# Patient Record
Sex: Male | Born: 1935 | Race: White | Hispanic: No | Marital: Married | Smoking: Never smoker
Health system: Southern US, Community
[De-identification: ages and names within clinical notes are randomized; demographics above are authoritative.]

## PROBLEM LIST (undated history)

## (undated) DIAGNOSIS — N400 Enlarged prostate without lower urinary tract symptoms: Secondary | ICD-10-CM

## (undated) DIAGNOSIS — E785 Hyperlipidemia, unspecified: Secondary | ICD-10-CM

## (undated) DIAGNOSIS — D62 Acute posthemorrhagic anemia: Secondary | ICD-10-CM

## (undated) DIAGNOSIS — K219 Gastro-esophageal reflux disease without esophagitis: Secondary | ICD-10-CM

## (undated) DIAGNOSIS — D0322 Melanoma in situ of left ear and external auricular canal: Secondary | ICD-10-CM

## (undated) DIAGNOSIS — K922 Gastrointestinal hemorrhage, unspecified: Secondary | ICD-10-CM

## (undated) DIAGNOSIS — K5790 Diverticulosis of intestine, part unspecified, without perforation or abscess without bleeding: Secondary | ICD-10-CM

## (undated) DIAGNOSIS — I10 Essential (primary) hypertension: Secondary | ICD-10-CM

## (undated) HISTORY — DX: Gastro-esophageal reflux disease without esophagitis: K21.9

## (undated) HISTORY — DX: Acute posthemorrhagic anemia: D62

## (undated) HISTORY — DX: Benign prostatic hyperplasia without lower urinary tract symptoms: N40.0

## (undated) HISTORY — DX: Hyperlipidemia, unspecified: E78.5

## (undated) HISTORY — DX: Melanoma in situ of left ear and external auricular canal: D03.22

## (undated) HISTORY — PX: CATARACT EXTRACTION: SUR2

## (undated) HISTORY — DX: Diverticulosis of intestine, part unspecified, without perforation or abscess without bleeding: K57.90

## (undated) HISTORY — DX: Gastrointestinal hemorrhage, unspecified: K92.2

## (undated) HISTORY — DX: Essential (primary) hypertension: I10

---

## 2001-05-16 ENCOUNTER — Encounter (INDEPENDENT_AMBULATORY_CARE_PROVIDER_SITE_OTHER): Payer: Self-pay

## 2001-05-16 ENCOUNTER — Other Ambulatory Visit: Admission: RE | Admit: 2001-05-16 | Discharge: 2001-05-16 | Payer: Self-pay | Admitting: Internal Medicine

## 2006-01-13 ENCOUNTER — Emergency Department (HOSPITAL_COMMUNITY): Admission: EM | Admit: 2006-01-13 | Discharge: 2006-01-14 | Payer: Self-pay | Admitting: Emergency Medicine

## 2006-05-27 ENCOUNTER — Ambulatory Visit: Payer: Self-pay | Admitting: Internal Medicine

## 2006-06-10 ENCOUNTER — Ambulatory Visit: Payer: Self-pay | Admitting: Internal Medicine

## 2009-05-22 ENCOUNTER — Encounter (INDEPENDENT_AMBULATORY_CARE_PROVIDER_SITE_OTHER): Payer: Self-pay | Admitting: *Deleted

## 2009-07-28 ENCOUNTER — Encounter (INDEPENDENT_AMBULATORY_CARE_PROVIDER_SITE_OTHER): Payer: Self-pay | Admitting: *Deleted

## 2009-07-29 ENCOUNTER — Ambulatory Visit: Payer: Self-pay | Admitting: Internal Medicine

## 2009-07-31 ENCOUNTER — Encounter (INDEPENDENT_AMBULATORY_CARE_PROVIDER_SITE_OTHER): Payer: Self-pay | Admitting: *Deleted

## 2010-01-20 ENCOUNTER — Telehealth (INDEPENDENT_AMBULATORY_CARE_PROVIDER_SITE_OTHER): Payer: Self-pay | Admitting: *Deleted

## 2010-09-29 NOTE — Progress Notes (Signed)
Summary: Schedule recall colonoscopy  Phone Note Outgoing Call Call back at [facility] Phone [phone]   Call placed by: Christie Nottingham CMA Duncan Dull),  Jan 20, 2010 2:07 PM Call placed to: Patient Summary of Call: left message for pt  to call back and schedule recall colonoscopy. Initial call taken by: Christie Nottingham CMA Duncan Dull),  Jan 20, 2010 2:07 PM  Follow-up for Phone Call        left message for pt  to call back  Follow-up by: Christie Nottingham CMA Duncan Dull),  January 28, 2010 10:55 AM

## 2010-12-28 ENCOUNTER — Emergency Department (HOSPITAL_COMMUNITY): Payer: Medicare Other

## 2010-12-28 ENCOUNTER — Emergency Department (HOSPITAL_COMMUNITY)
Admission: EM | Admit: 2010-12-28 | Discharge: 2010-12-28 | Disposition: A | Discharge status: Home / Self Care | Payer: Medicare Other | Attending: Emergency Medicine | Admitting: Emergency Medicine

## 2010-12-28 DIAGNOSIS — Z79899 Other long term (current) drug therapy: Secondary | ICD-10-CM | POA: Insufficient documentation

## 2010-12-28 DIAGNOSIS — I10 Essential (primary) hypertension: Secondary | ICD-10-CM | POA: Insufficient documentation

## 2010-12-28 DIAGNOSIS — R259 Unspecified abnormal involuntary movements: Secondary | ICD-10-CM | POA: Insufficient documentation

## 2010-12-28 DIAGNOSIS — H538 Other visual disturbances: Secondary | ICD-10-CM | POA: Insufficient documentation

## 2010-12-28 LAB — CBC
MCH: 30.6 pg (ref 26.0–34.0)
MCV: 85 fL (ref 78.0–100.0)
Platelets: 258 10*3/uL (ref 150–400)
RDW: 12.8 % (ref 11.5–15.5)

## 2010-12-28 LAB — DIFFERENTIAL
Eosinophils Absolute: 0.2 10*3/uL (ref 0.0–0.7)
Eosinophils Relative: 2 % (ref 0–5)
Lymphs Abs: 2.1 10*3/uL (ref 0.7–4.0)
Monocytes Absolute: 0.8 10*3/uL (ref 0.1–1.0)
Monocytes Relative: 10 % (ref 3–12)

## 2010-12-28 LAB — POCT I-STAT, CHEM 8
Calcium, Ion: 1 mmol/L — ABNORMAL LOW (ref 1.12–1.32)
HCT: 40 % (ref 39.0–52.0)
TCO2: 26 mmol/L (ref 0–100)

## 2012-03-14 ENCOUNTER — Encounter: Payer: Self-pay | Admitting: Internal Medicine

## 2016-08-16 DIAGNOSIS — I10 Essential (primary) hypertension: Secondary | ICD-10-CM | POA: Diagnosis not present

## 2016-08-16 DIAGNOSIS — K219 Gastro-esophageal reflux disease without esophagitis: Secondary | ICD-10-CM

## 2016-08-16 DIAGNOSIS — K922 Gastrointestinal hemorrhage, unspecified: Secondary | ICD-10-CM

## 2016-08-16 DIAGNOSIS — K5790 Diverticulosis of intestine, part unspecified, without perforation or abscess without bleeding: Secondary | ICD-10-CM

## 2016-08-16 DIAGNOSIS — N400 Enlarged prostate without lower urinary tract symptoms: Secondary | ICD-10-CM

## 2016-08-16 DIAGNOSIS — D62 Acute posthemorrhagic anemia: Secondary | ICD-10-CM | POA: Diagnosis not present

## 2016-08-16 DIAGNOSIS — E785 Hyperlipidemia, unspecified: Secondary | ICD-10-CM | POA: Diagnosis not present

## 2016-08-17 DIAGNOSIS — I10 Essential (primary) hypertension: Secondary | ICD-10-CM

## 2016-08-17 DIAGNOSIS — D62 Acute posthemorrhagic anemia: Secondary | ICD-10-CM

## 2016-08-17 DIAGNOSIS — N400 Enlarged prostate without lower urinary tract symptoms: Secondary | ICD-10-CM

## 2016-08-17 DIAGNOSIS — K922 Gastrointestinal hemorrhage, unspecified: Secondary | ICD-10-CM

## 2016-08-17 DIAGNOSIS — E785 Hyperlipidemia, unspecified: Secondary | ICD-10-CM

## 2016-08-17 DIAGNOSIS — K219 Gastro-esophageal reflux disease without esophagitis: Secondary | ICD-10-CM

## 2016-08-17 DIAGNOSIS — K5790 Diverticulosis of intestine, part unspecified, without perforation or abscess without bleeding: Secondary | ICD-10-CM

## 2016-08-18 DIAGNOSIS — D62 Acute posthemorrhagic anemia: Secondary | ICD-10-CM | POA: Diagnosis not present

## 2016-08-18 DIAGNOSIS — N400 Enlarged prostate without lower urinary tract symptoms: Secondary | ICD-10-CM | POA: Diagnosis not present

## 2016-08-18 DIAGNOSIS — K922 Gastrointestinal hemorrhage, unspecified: Secondary | ICD-10-CM | POA: Diagnosis not present

## 2016-08-18 DIAGNOSIS — K5790 Diverticulosis of intestine, part unspecified, without perforation or abscess without bleeding: Secondary | ICD-10-CM | POA: Diagnosis not present

## 2016-08-19 DIAGNOSIS — D62 Acute posthemorrhagic anemia: Secondary | ICD-10-CM | POA: Diagnosis not present

## 2016-08-19 DIAGNOSIS — N400 Enlarged prostate without lower urinary tract symptoms: Secondary | ICD-10-CM | POA: Diagnosis not present

## 2016-08-19 DIAGNOSIS — K5790 Diverticulosis of intestine, part unspecified, without perforation or abscess without bleeding: Secondary | ICD-10-CM | POA: Diagnosis not present

## 2016-08-19 DIAGNOSIS — K922 Gastrointestinal hemorrhage, unspecified: Secondary | ICD-10-CM | POA: Diagnosis not present

## 2016-08-20 DIAGNOSIS — K5790 Diverticulosis of intestine, part unspecified, without perforation or abscess without bleeding: Secondary | ICD-10-CM | POA: Diagnosis not present

## 2016-08-20 DIAGNOSIS — K922 Gastrointestinal hemorrhage, unspecified: Secondary | ICD-10-CM | POA: Diagnosis not present

## 2016-08-20 DIAGNOSIS — D62 Acute posthemorrhagic anemia: Secondary | ICD-10-CM | POA: Diagnosis not present

## 2016-08-20 DIAGNOSIS — N400 Enlarged prostate without lower urinary tract symptoms: Secondary | ICD-10-CM | POA: Diagnosis not present

## 2016-09-20 ENCOUNTER — Encounter: Payer: Self-pay | Admitting: Internal Medicine

## 2016-11-04 ENCOUNTER — Ambulatory Visit (INDEPENDENT_AMBULATORY_CARE_PROVIDER_SITE_OTHER): Payer: Medicare Other | Admitting: Internal Medicine

## 2016-11-04 ENCOUNTER — Encounter (INDEPENDENT_AMBULATORY_CARE_PROVIDER_SITE_OTHER): Payer: Self-pay

## 2016-11-04 ENCOUNTER — Encounter: Payer: Self-pay | Admitting: Internal Medicine

## 2016-11-04 VITALS — BP 160/134 | HR 72 | Ht 71.46 in | Wt 216.4 lb

## 2016-11-04 DIAGNOSIS — D62 Acute posthemorrhagic anemia: Secondary | ICD-10-CM | POA: Diagnosis not present

## 2016-11-04 DIAGNOSIS — Z8719 Personal history of other diseases of the digestive system: Secondary | ICD-10-CM

## 2016-11-04 MED ORDER — NA SULFATE-K SULFATE-MG SULF 17.5-3.13-1.6 GM/177ML PO SOLN: 1.0000 | Freq: Once | ORAL | 0 refills | Status: AC

## 2016-11-04 NOTE — Progress Notes (Signed)
HISTORY OF PRESENT ILLNESS:  Donald Ball is a 81 y.o. male who is referred by his primary care provider Dr. Welton FlakesKhan after hospitalization elsewhere for acute GI bleeding. I have not seen the patient since October 2007 when he underwent colonoscopy for history of adenomatous colon polyps. Index examination performed in 2002 revealed large tubulovillous adenoma. Follow-up examination 2003. Most recent examination October 2007 with severe diverticulosis throughout but no polyps. Follow-up in 3 years recommended. He has not followed up and acknowledges such. He is accompanied today by his wife. He was in usual state of health until December 2017 when while at home he developed significant painless hematochezia. He presented to his local hospital. Several additional bouts of hematochezia associated with a syncopal spell. He was seen by gastroenterology. I'm told that he had an unremarkable upper endoscopy. He was in the hospital for about 5 days. Bleeding resolved without recurrence. He was placed on iron supplements. His blood counts were as low as 7.8 for which she received 3 units of packed red blood cells while hospitalized. Outpatient hemoglobin in January was 10. Most recently in February 12.1. Patient does complain of occasional left-sided cramping discomfort though this is infrequent. Does have a history of GERD for which she takes omeprazole 20 mg daily. Despite this, some breakthrough symptoms. No dysphagia. GI review of systems otherwise negative. He does have questions regarding prophylactic daily aspirin for which he was told to stop  REVIEW OF SYSTEMS:  All non-GI ROS negative upon extensive comprehensive review  Past Medical History:  Diagnosis Date  . Anemia associated with acute blood loss   . Benign essential hypertension   . BPH (benign prostatic hyperplasia)   . Diverticulosis   . GERD (gastroesophageal reflux disease)   . GI bleed   . Hyperlipidemia   . Melanoma in situ of left  ear [facility](HCC)     Past Surgical History:  Procedure Laterality Date  . CATARACT EXTRACTION      Social History Donald ChristmasJack V Lemley  reports that he has never smoked. He has never used smokeless tobacco. He reports that he does not drink alcohol or use drugs.  family history includes Diabetes in his brother and sister; Lung cancer in his father; Other in his mother; Prostate cancer in his brother.  Allergies  Allergen Reactions  . Altace [Ramipril]   . Hydralazine   . Nisoldipine     REACTION: skin turns red  . Norvasc [Amlodipine Besylate]   . Penicillins     REACTION: blisters  . Sular [Nisoldipine Er]        PHYSICAL EXAMINATION: Vital signs: BP (!) 160/134   Pulse 72   Ht 5' 11.46" (1.815 m)   Wt 216 lb 6 oz (98.1 kg)   BMI 29.79 kg/m   Constitutional: generally well-appearing, no acute distress Psychiatric: alert and oriented x3, cooperative Eyes: extraocular movements intact, anicteric, conjunctiva pink Mouth: oral pharynx moist, no lesions Neck: supple no lymphadenopathy Cardiovascular: heart regular rate and rhythm, no murmur Lungs: clear to auscultation bilaterally Abdomen: soft, nontender, nondistended, no obvious ascites, no peritoneal signs, normal bowel sounds, no organomegaly Rectal:Deferred until colonoscopy Extremities: no clubbing cyanosis or lower extremity edema bilaterally Skin: no lesions on visible extremities Neuro: No focal deficits. Normal DTRs. Cranial nerves intact  ASSESSMENT:  #1. Acute lower GI bleed. Most likely diverticular. Resolved #2. Acute blood loss anemia secondary to #1 above. Required transfusions. Most recent hemoglobin during normal #3. GERD. Symptoms despite omeprazole 20 mg daily #4. History  of advanced adenoma. Multiple prior colonoscopies as described. Last examination 2007. Overdue for surveillance.  PLAN:  #1. Reflux precautions #2. Increase omeprazole to 40 mg daily #3. Schedule colonoscopy for surveillance and  evaluation post recent bleed.The nature of the procedure, as well as the risks, benefits, and alternatives were carefully and thoroughly reviewed with the patient. Ample time for discussion and questions allowed. The patient understood, was satisfied, and agreed to proceed. #4. Okay to use aspirin if medically necessary from GI standpoint. I told him to talk to his PCP regarding this issue. #5Molli Knock to discontinue iron after completing current course of therapy  A copy of this consultation note has been sent to Dr. Welton Flakes

## 2016-11-04 NOTE — Patient Instructions (Signed)
Per Dr. Marina GoodellPerry, increase your PPI to 40mg  daily  You have been scheduled for a colonoscopy. Please follow written instructions given to you at your visit today.  Please pick up your prep supplies at the pharmacy within the next 1-3 days. If you use inhalers (even only as needed), please bring them with you on the day of your procedure. Your physician has requested that you go to www.startemmi.com and enter the access code given to you at your visit today. This web site gives a general overview about your procedure. However, you should still follow specific instructions given to you by our office regarding your preparation for the procedure.

## 2017-01-03 ENCOUNTER — Encounter: Payer: Self-pay | Admitting: Internal Medicine

## 2017-01-17 ENCOUNTER — Ambulatory Visit: Payer: Medicare Other | Admitting: Internal Medicine

## 2017-01-17 NOTE — Progress Notes (Signed)
Patient arrived to admitting for colonoscopy per Dr. Marina GoodellPerry this am. MOA, Dimitri PedBarbara Benitez, took patient's initial VS and noted electronic BP as 217/144. Patient states he feels a little lightheaded this am, but denies headache or other symptoms. Retake was 236/142 electronically per MOA. [facility] C[facility] who advised Dr. Marina GoodellPerry. Advised we have patient relax in bay on stretcher for a few minutes. Patient's wife brought back to sit with patient. MOA retook manual BP with a result of 244/180. Dr. Marina GoodellPerry advised. Manual retake by myself was 210/110. Dr. Marina GoodellPerry here to see patient. Procedure cancelled per Dr. Marina GoodellPerry. Dr. Marina GoodellPerry advised patient to follow up with his primary care physician today and reschedule colonoscopy with his BP is under better control. Patient has made an appointment with Mr. Brett AlbinoRhyne, P.A. At his primary care office at 1440 today.

## 2017-02-12 ENCOUNTER — Other Ambulatory Visit: Payer: Self-pay

## 2017-02-12 ENCOUNTER — Emergency Department (HOSPITAL_COMMUNITY): Payer: Medicare Other

## 2017-02-12 ENCOUNTER — Emergency Department (HOSPITAL_COMMUNITY)
Admission: EM | Admit: 2017-02-12 | Discharge: 2017-02-12 | Disposition: A | Discharge status: Home / Self Care | Payer: Medicare Other | Attending: Emergency Medicine | Admitting: Emergency Medicine

## 2017-02-12 ENCOUNTER — Encounter (HOSPITAL_COMMUNITY): Payer: Self-pay | Admitting: Emergency Medicine

## 2017-02-12 DIAGNOSIS — K219 Gastro-esophageal reflux disease without esophagitis: Secondary | ICD-10-CM | POA: Insufficient documentation

## 2017-02-12 DIAGNOSIS — R1032 Left lower quadrant pain: Secondary | ICD-10-CM | POA: Insufficient documentation

## 2017-02-12 DIAGNOSIS — Z79899 Other long term (current) drug therapy: Secondary | ICD-10-CM | POA: Diagnosis not present

## 2017-02-12 DIAGNOSIS — I10 Essential (primary) hypertension: Secondary | ICD-10-CM

## 2017-02-12 DIAGNOSIS — R531 Weakness: Secondary | ICD-10-CM | POA: Diagnosis present

## 2017-02-12 DIAGNOSIS — R110 Nausea: Secondary | ICD-10-CM

## 2017-02-12 DIAGNOSIS — R42 Dizziness and giddiness: Secondary | ICD-10-CM | POA: Diagnosis not present

## 2017-02-12 DIAGNOSIS — R112 Nausea with vomiting, unspecified: Secondary | ICD-10-CM | POA: Diagnosis not present

## 2017-02-12 LAB — HEPATIC FUNCTION PANEL
ALBUMIN: 3.9 g/dL (ref 3.5–5.0)
ALK PHOS: 47 U/L (ref 38–126)
ALT: 13 U/L — AB (ref 17–63)
AST: 23 U/L (ref 15–41)
Bilirubin, Direct: 0.1 mg/dL (ref 0.1–0.5)
Indirect Bilirubin: 0.3 mg/dL (ref 0.3–0.9)
TOTAL PROTEIN: 7.4 g/dL (ref 6.5–8.1)
Total Bilirubin: 0.4 mg/dL (ref 0.3–1.2)

## 2017-02-12 LAB — URINALYSIS, ROUTINE W REFLEX MICROSCOPIC
Bilirubin Urine: NEGATIVE
Glucose, UA: 50 mg/dL — AB
Hgb urine dipstick: NEGATIVE
Ketones, ur: NEGATIVE mg/dL
Leukocytes, UA: NEGATIVE
Nitrite: NEGATIVE
Protein, ur: NEGATIVE mg/dL
Specific Gravity, Urine: 1.009 (ref 1.005–1.030)
pH: 8 (ref 5.0–8.0)

## 2017-02-12 LAB — CBC
HCT: 40.2 % (ref 39.0–52.0)
Hemoglobin: 13.6 g/dL (ref 13.0–17.0)
MCH: 29.5 pg (ref 26.0–34.0)
MCHC: 33.8 g/dL (ref 30.0–36.0)
MCV: 87.2 fL (ref 78.0–100.0)
Platelets: 251 K/uL (ref 150–400)
RBC: 4.61 MIL/uL (ref 4.22–5.81)
RDW: 14.5 % (ref 11.5–15.5)
WBC: 11.3 K/uL — ABNORMAL HIGH (ref 4.0–10.5)

## 2017-02-12 LAB — BASIC METABOLIC PANEL WITH GFR
Anion gap: 9 (ref 5–15)
BUN: 11 mg/dL (ref 6–20)
CO2: 25 mmol/L (ref 22–32)
Calcium: 8.8 mg/dL — ABNORMAL LOW (ref 8.9–10.3)
Chloride: 98 mmol/L — ABNORMAL LOW (ref 101–111)
Creatinine, Ser: 0.93 mg/dL (ref 0.61–1.24)
GFR calc Af Amer: >60 mL/min
GFR calc non Af Amer: >60 mL/min
Glucose, Bld: 159 mg/dL — ABNORMAL HIGH (ref 65–99)
Potassium: 4.1 mmol/L (ref 3.5–5.1)
Sodium: 132 mmol/L — ABNORMAL LOW (ref 135–145)

## 2017-02-12 LAB — CBG MONITORING, ED: Glucose-Capillary: 168 mg/dL — ABNORMAL HIGH (ref 65–99)

## 2017-02-12 LAB — I-STAT TROPONIN, ED: Troponin i, poc: 0 ng/mL (ref 0.00–0.08)

## 2017-02-12 LAB — TROPONIN I

## 2017-02-12 LAB — I-STAT CG4 LACTIC ACID, ED: Lactic Acid, Venous: 1.85 mmol/L (ref 0.5–1.9)

## 2017-02-12 LAB — LIPASE, BLOOD: Lipase: 28 U/L (ref 11–51)

## 2017-02-12 MED ORDER — IRBESARTAN 300 MG PO TABS
300.0000 mg | ORAL_TABLET | Freq: Every day | ORAL | Status: DC
  Administered 2017-02-12: 300 mg via ORAL
  Filled 2017-02-12 (×2): qty 1

## 2017-02-12 MED ORDER — IOPAMIDOL (ISOVUE-300) INJECTION 61%
INTRAVENOUS | Status: AC
  Administered 2017-02-12: 100 mL
  Filled 2017-02-12: qty 100

## 2017-02-12 MED ORDER — CARVEDILOL 12.5 MG PO TABS: 25.0000 mg | ORAL_TABLET | Freq: Two times a day (BID) | ORAL | Status: DC

## 2017-02-12 MED ORDER — HYDROCHLOROTHIAZIDE 12.5 MG PO CAPS
12.5000 mg | ORAL_CAPSULE | Freq: Every day | ORAL | Status: DC
  Administered 2017-02-12: 12.5 mg via ORAL
  Filled 2017-02-12: qty 1

## 2017-02-12 MED ORDER — LABETALOL HCL 5 MG/ML IV SOLN
10.0000 mg | Freq: Once | INTRAVENOUS | Status: AC
  Administered 2017-02-12: 10 mg via INTRAVENOUS
  Filled 2017-02-12: qty 4

## 2017-02-12 MED ORDER — CLONIDINE HCL 0.1 MG PO TABS
0.1000 mg | ORAL_TABLET | Freq: Once | ORAL | Status: AC
  Administered 2017-02-12: 0.1 mg via ORAL
  Filled 2017-02-12: qty 1

## 2017-02-12 MED ORDER — ONDANSETRON HCL 4 MG/2ML IJ SOLN
4.0000 mg | Freq: Once | INTRAMUSCULAR | Status: AC
  Administered 2017-02-12: 4 mg via INTRAVENOUS
  Filled 2017-02-12: qty 2

## 2017-02-12 MED ORDER — PANTOPRAZOLE SODIUM 40 MG PO TBEC
40.0000 mg | DELAYED_RELEASE_TABLET | Freq: Every day | ORAL | Status: DC
  Administered 2017-02-12: 40 mg via ORAL
  Filled 2017-02-12: qty 1

## 2017-02-12 NOTE — ED Notes (Signed)
Pt given warm blankets and MD notified of temp

## 2017-02-12 NOTE — ED Notes (Signed)
Provider at the bedside.  

## 2017-02-12 NOTE — ED Provider Notes (Signed)
Patient is accepted signout from Dr. Manus Gunningancour.  I have reviewed history of present illness with the patient and family. He had been at baseline late in the evening. He reports he does get up to go to the bathroom multiple times. He had been up around midnight and had not had any particular symptoms. About an hour and a half later he awakened and felt extremely weak and nauseated. Family members report he was very diaphoretic and pale. He complained of being dizzy. His wife did check his blood pressure at that time and she reports it was 200/114. They did call EMS. Just shortly after EMS arrival patient vomited several times. He felt somewhat improved after emesis. He did not have focal neurologic deficit. He denies he was having a headache or chest pain. Symptoms gradually improved and at this time patient reports he feels better. Family reports he is improved and at baseline. Blood pressures however have remained elevated.  On exam patient is alert and appropriate. No respiratory distress. Heart is regular with distant heart sounds. Lungs are clear with good airflow. Abdomen is soft and nontender. Movements recorded in a purposeful symmetric.  At this time, I will have the patient get his a.m. blood pressure medications as he is now somewhat overdue based on his usual schedule. Due to elevated blood pressure will add a dose of 10 mg IV labetalol. Will continue to observe the patient and reassess. He is currently asymptomatic the blood pressure remains significantly elevated with last reading greater than 200 systolic.  Patient has been given his medications and when necessary IV labetalol. He has remained asymptomatic. Patient feels well. Blood pressures are now at 150/100 with no associated symptoms. At this time patient is stable for discharge. He and family are counseled on signs and symptoms for return. He does have follow-up appointment Tuesday with PCP for ongoing blood pressure monitoring.    Arby BarrettePfeiffer, Mousa Prout, MD 02/12/17 [phone]

## 2017-02-12 NOTE — ED Notes (Signed)
Pt returned from CT °

## 2017-02-12 NOTE — ED Triage Notes (Signed)
Per EMS: Pt to ED for generalized weakness that started tonight after going to bathroom. Pt pale, diaphoretic, and hypertensive. Pt c/o N, V. Pt had 4 mg of Zofran PTA. Pt BP 230/120 with EMS.

## 2017-02-12 NOTE — ED Notes (Signed)
Patient transported to CT 

## 2017-02-12 NOTE — ED Provider Notes (Signed)
[facility]-EMERGENCY DEPT Provider Note   CSN: [id number] Arrival date & time: 02/12/17  4098 By signing my name below, I, Levon Hedger, attest that this documentation has been prepared under the direction and in the presence of Aidan Caloca, Jeannett Senior, MD . Electronically Signed: Levon Hedger, Scribe. 02/12/2017. 4:16 AM.   History   Chief Complaint Chief Complaint  Patient presents with  . Weakness   HPI MATTHEW CINA is a 81 y.o. male with a history of HTN, GERD, and diverticulosis who presents to the Emergency Department complaining of sudden onset nausea and vomiting onset at 1:30 this AM. Per pt, he was awoken by vomiting. He reports 2-3 episodes of NBNB vomiting PTA. He notes associated left-sided abdominal pain, diarrhea, subjective fever, and left lateral back pain. No alleviating or modifying factors noted.  No OTC treatments tried for these symptoms PTA.  No abdominal SHx. No sick contact. Pt denies any CP, headache, testicular pain, dysuria, hematuria,   The history is provided by the patient. No language interpreter was used.    Past Medical History:  Diagnosis Date  . Anemia associated with acute blood loss   . Benign essential hypertension   . BPH (benign prostatic hyperplasia)   . Diverticulosis   . GERD (gastroesophageal reflux disease)   . GI bleed   . Hyperlipidemia   . Melanoma in situ of left ear (HCC)    There are no active problems to display for this patient.  Past Surgical History:  Procedure Laterality Date  . CATARACT EXTRACTION      Home Medications    Prior to Admission medications   Medication Sig Start Date End Date Taking? Authorizing Provider  Ascorbic Acid (VITAMIN C) 100 MG tablet Take 500 mg by mouth daily.     [provider]  carvedilol (COREG) 25 MG tablet Take 25 mg by mouth 2 (two) times daily with a meal.    [provider]  doxazosin (CARDURA) 4 MG tablet Take 4 mg by mouth daily.    [provider]  Fe  Fumarate-B12-Vit C-FA-IFC (FEROCON PO) Take 1 capsule by mouth 2 (two) times daily.    [provider]  finasteride (PROSCAR) 5 MG tablet Take 5 mg by mouth daily.    [provider]  ketotifen (ZADITOR) 0.025 % ophthalmic solution 1 drop 2 (two) times daily.    [provider]  Multiple Vitamin (MULTIVITAMIN) tablet Take 1 tablet by mouth daily.    [provider]  olmesartan-hydrochlorothiazide (BENICAR HCT) 40-25 MG tablet Take 1 tablet by mouth daily.    [provider]  omeprazole (PRILOSEC) 20 MG capsule Take 20 mg by mouth daily.    [provider]    Family History Family History  Problem Relation Age of Onset  . Lung cancer Father   . Other Mother        died from old age  . Diabetes Sister   . Diabetes Brother   . Prostate cancer Brother   . Colon cancer Neg Hx   . Stomach cancer Neg Hx   . Rectal cancer Neg Hx   . Esophageal cancer Neg Hx   . Liver cancer Neg Hx     Social History Social History  Substance Use Topics  . Smoking status: Never Smoker  . Smokeless tobacco: Never Used  . Alcohol use No     Allergies   Altace [ramipril]; Hydralazine; Nisoldipine; Norvasc [amlodipine besylate]; Penicillins; and Sular [nisoldipine er]   Review of Systems  Review of Systems All systems reviewed and are negative for acute change except as noted in the HPI.  Physical Exam Updated Vital Signs BP (!) 220/174 (BP Location: Left Arm)   Pulse (!) 54   Resp 12   SpO2 100%   Physical Exam  Constitutional: He is oriented to person, place, and time. He appears well-developed and well-nourished. No distress.  Clammy, diaphoretic. Actively vomiting.  HENT:  Head: Normocephalic and atraumatic.  Mouth/Throat: Oropharynx is clear and moist. No oropharyngeal exudate.  Eyes: Conjunctivae and EOM are normal. Pupils are equal, round, and reactive to light.  Neck: Normal range of motion. Neck supple.  No meningismus.    Cardiovascular: Normal rate, regular rhythm, normal heart sounds and intact distal pulses.   No murmur heard. Pulmonary/Chest: Effort normal and breath sounds normal. No respiratory distress.  Abdominal: Soft. There is tenderness. There is guarding. There is no rebound.  LLQ and suprapubic tenderness with voluntary guarding.   Musculoskeletal: Normal range of motion. He exhibits no edema or tenderness.  Neurological: He is alert and oriented to person, place, and time. No cranial nerve deficit. He exhibits normal muscle tone. Coordination normal.   5/5 strength throughout. CN 2-12 intact.Equal grip strength.   Skin: Skin is warm. He is diaphoretic.  Psychiatric: He has a normal mood and affect. His behavior is normal.  Nursing note and vitals reviewed.   ED Treatments / Results  DIAGNOSTIC STUDIES:  Oxygen Saturation is 100% on RA, normal by my interpretation.    COORDINATION OF CARE:  4:15 AM Discussed treatment plan with pt at bedside and pt agreed to plan.   Labs (all labs ordered are listed, but only abnormal results are displayed) Labs Reviewed  BASIC METABOLIC PANEL - Abnormal; Notable for the following:       Result Value   Sodium 132 (*)    Chloride 98 (*)    Glucose, Bld 159 (*)    Calcium 8.8 (*)    All other components within normal limits  CBC - Abnormal; Notable for the following:    WBC 11.3 (*)    All other components within normal limits  URINALYSIS, ROUTINE W REFLEX MICROSCOPIC - Abnormal; Notable for the following:    Color, Urine STRAW (*)    Glucose, UA 50 (*)    All other components within normal limits  HEPATIC FUNCTION PANEL - Abnormal; Notable for the following:    ALT 13 (*)    All other components within normal limits  CBG MONITORING, ED - Abnormal; Notable for the following:    Glucose-Capillary 168 (*)    All other components within normal limits  LIPASE, BLOOD  TROPONIN I  I-STAT CG4 LACTIC ACID, ED  I-STAT CG4 LACTIC ACID, ED    EKG   EKG Interpretation None       Radiology Ct Head Wo Contrast  Result Date: 02/12/2017 CLINICAL DATA:  Nausea and dizziness.  Hypertension. EXAM: CT HEAD WITHOUT CONTRAST TECHNIQUE: Contiguous axial images were obtained from the base of the skull through the vertex without intravenous contrast. COMPARISON:  CT head without contrast 12/01/2010 FINDINGS: Brain: Mild atrophy and white matter changes are within normal limits for age. No acute infarct, hemorrhage, or mass lesion is present. The basal ganglia are intact. The insular ribbon is normal bilaterally. No acute or focal cortical defects are present. The brainstem and cerebellum are normal. Vascular: No hyperdense vessel or unexpected calcification. Skull: Normal. Negative for fracture or focal lesion. Sinuses/Orbits: Minimal mucosal  thickening is present floor of the maxillary sinuses bilaterally. The paranasal sinuses an mastoid air cells are otherwise clear. Bilateral lens replacements are present. The globes and orbits are otherwise within normal limits. IMPRESSION: 1. Normal CT appearance of the brain for age. 2. No acute or focal abnormality to explain the patient's dizziness or nausea. Electronically Signed   By: Marin Roberts M.D.   On: 02/12/2017 08:16   Ct Abdomen Pelvis W Contrast  Result Date: 02/12/2017 CLINICAL DATA:  Nausea and vomiting. Left lower quadrant abdominal pain since 1 a.m. EXAM: CT ABDOMEN AND PELVIS WITH CONTRAST TECHNIQUE: Multidetector CT imaging of the abdomen and pelvis was performed using the standard protocol following bolus administration of intravenous contrast. CONTRAST:  ISOVUE-300 IOPAMIDOL (ISOVUE-300) INJECTION 61% COMPARISON:  CT of the abdomen and pelvis 08/16/2016 FINDINGS: Lower chest: Mild dependent atelectasis is present at the lung bases bilaterally. The heart size is normal. No significant pleural or pericardial effusion is present. Hepatobiliary: The hemangioma within the left hepatic lobe  is stable. No other discrete hepatic lesions are present. Cholelithiasis is again seen. No significant inflammatory changes are present about the gallbladder. The common bile duct is within normal limits. Pancreas: Unremarkable. No pancreatic ductal dilatation or surrounding inflammatory changes. Spleen: Punctate calcifications within the spleen are stable. No discrete lesions are present. Adrenals/Urinary Tract: The adrenal glands are normal bilaterally. A nonobstructing stone at the upper pole of the right kidney measures 8 mm. Two additional punctate nonobstructing stones are present at the lower pole. A 5 mm nonobstructing stone is present at the upper pole of the left kidney. The ureters are within normal limits bilaterally. The urinary bladder is unremarkable. Stomach/Bowel: A small hiatal hernia is present. The stomach and duodenum are otherwise within normal limits. Small bowel is unremarkable. The appendix is not discretely visualized. No secondary inflammatory changes are present. Diverticular present throughout the colon. No focal inflammation is evident to suggest diverticulitis. The descending and sigmoid colon are within normal limits. Vascular/Lymphatic: Minimal atherosclerotic calcifications are present without aneurysm. No significant retroperitoneal or pelvic adenopathy is present. Reproductive: The prostate is enlarged and irregular, similar to the prior study. This creates some mass effect along the floor of the bladder. Musculoskeletal: Bilateral L5 pars defects are again seen. Grade 1 anterolisthesis measures 12 mm. Slight retrolisthesis is present at L4-5. Vertebral body heights and alignment are otherwise normal and stable. The bony pelvis is intact. The hips are located and within normal limits. IMPRESSION: 1. Colonic diverticulosis without focal inflammation to suggest diverticulitis. 2. Bilateral nonobstructive nephrolithiasis is new. 3. Cholelithiasis without cholecystitis. 4. Bilateral  L5 pars defects. 5. Enlarged irregular prostate gland without significant interval change. Electronically Signed   By: Marin Roberts M.D.   On: 02/12/2017 08:34   Dg Abdomen Acute W/chest  Result Date: 02/12/2017 CLINICAL DATA:  Generalized weakness.  Vomiting. EXAM: DG ABDOMEN ACUTE W/ 1V CHEST COMPARISON:  CT abdomen pelvis 08/16/2016 Chest radiograph 08/16/2016 FINDINGS: Shallow lung inflation without focal airspace disease. Calcified granuloma in the left lower lung. There is cardiomegaly without pulmonary edema. No focal consolidation. No pleural effusion or pneumothorax. No free intraperitoneal air. Bowel gas pattern is nonobstructive. There is a large amount of stool in the colon. IMPRESSION: 1. Cardiomegaly without focal airspace disease. 2. No free intraperitoneal air. 3. Nonobstructive bowel gas pattern with large amount of colonic stool. Electronically Signed   By: Deatra Robinson M.D.   On: 02/12/2017 05:44    Procedures Procedures (including critical care time)  Medications Ordered in ED Medications - No data to display   Initial Impression / Assessment and Plan / ED Course  I have reviewed the triage vital signs and the nursing notes.  Pertinent labs & imaging results that were available during my care of the patient were reviewed by me and considered in my medical decision making (see chart for details).     Acute onset lower abdominal pain with nausea and vomiting. 1 loose stool. No chest pain.  Hypertensive for EMS.  TTP L abdomen with voluntary guarding.  AAS without obstruction or free air EKG unchanged.  Labs reassuring, troponin negative. Lactate normal. UA negative.  PO clonidine given for BP control given multiple allergies.  No headache or chest pain.  CT a/p pending at time of sign out. Disposition pending results as well as BP control.  Dr. Donnald Garre to assume care.   Final Clinical Impressions(s) / ED Diagnoses   Final diagnoses:  None    New  Prescriptions New Prescriptions   No medications on file   I personally performed the services described in this documentation, which was scribed in my presence. The recorded information has been reviewed and is accurate.   Glynn Octave, MD 02/12/17 [phone]

## 2017-09-05 IMAGING — CT CT HEAD W/O CM
3 series · 16 of 47 positions shown, 19 images · non-contrast
Comparison: CT head without contrast 12/01/2010

CLINICAL DATA: Nausea and dizziness.  Hypertension.

EXAM:
CT HEAD WITHOUT CONTRAST
TECHNIQUE: Contiguous axial images were obtained from the base of the skull
through the vertex without intravenous contrast.

[Series 3: head 5.0 h30s · axial · 0.44mm/px · z∈[-146,-6]mm · 10 of 34 slices shown, 13 images]
[im 3/34  brain]
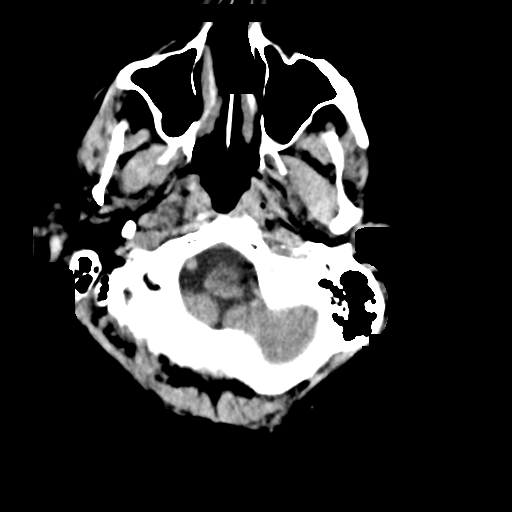
[im 3/34  bone]
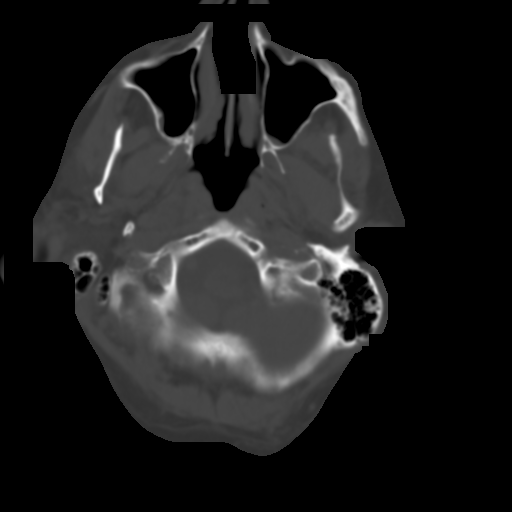
[im 6/34  brain]
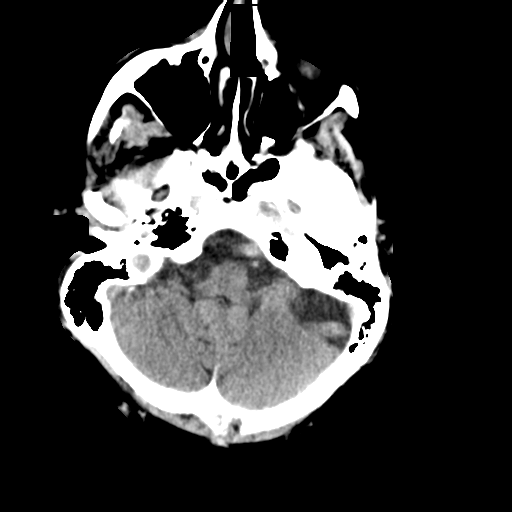
[im 10/34  brain]
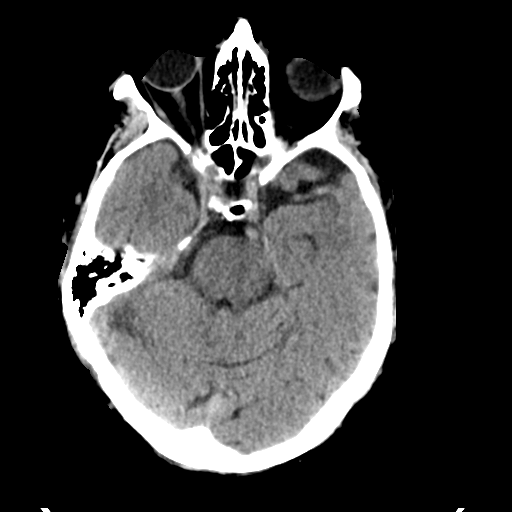
[im 12/34  brain]
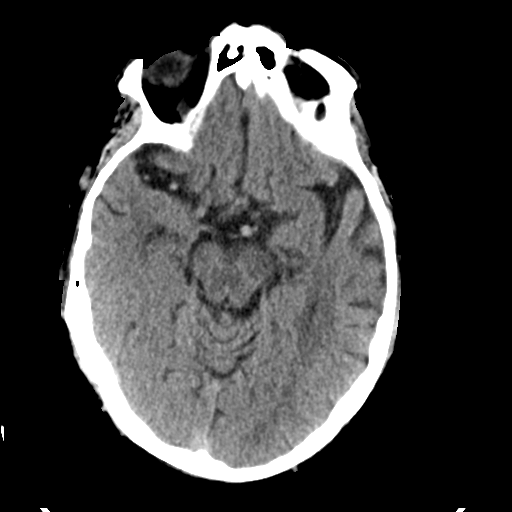
[im 15/34  brain]
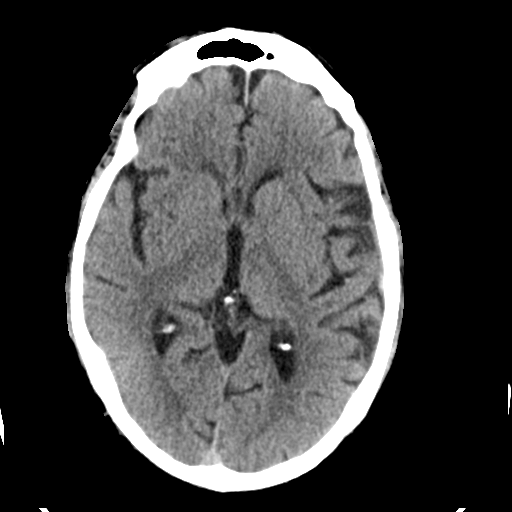
[im 15/34  bone]
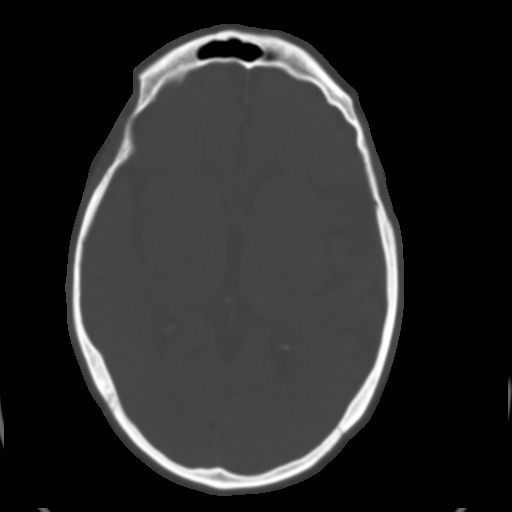
[im 19/34  brain]
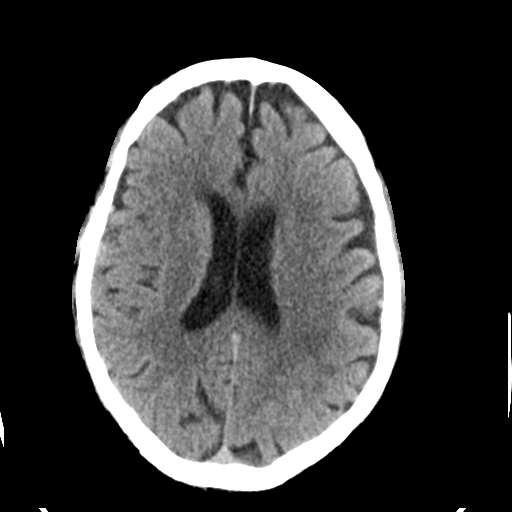
[im 22/34  brain]
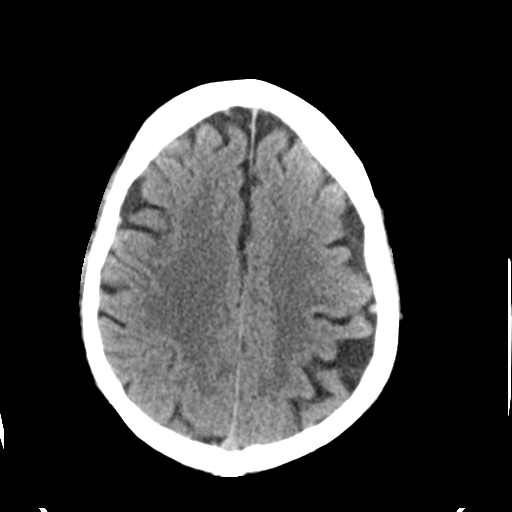
[im 26/34  brain]
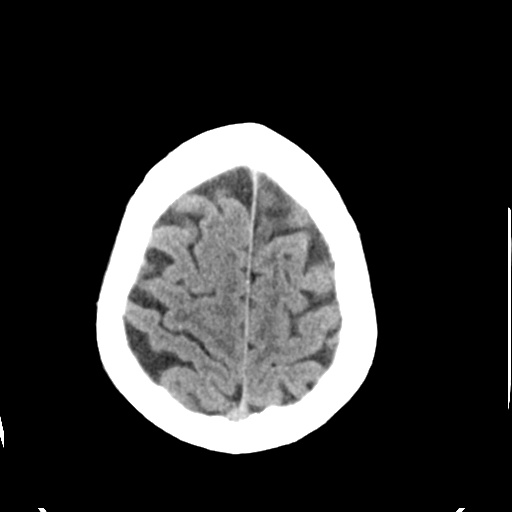
[im 28/34  brain]
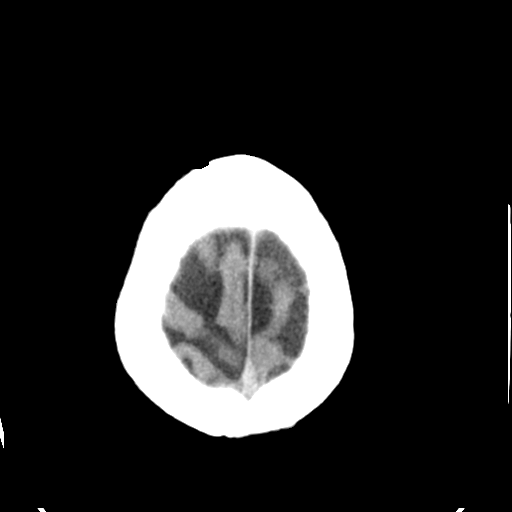
[im 28/34  bone]
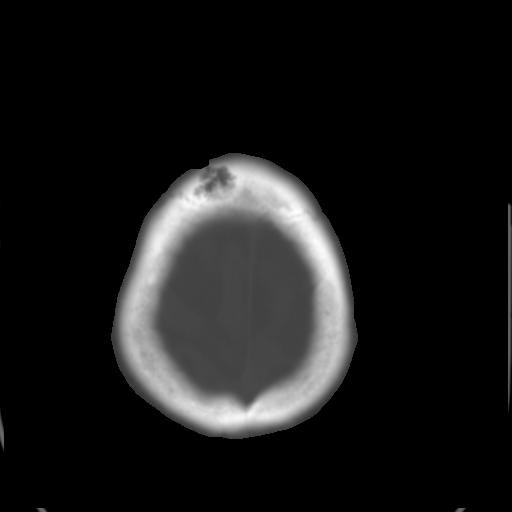
[im 31/34  brain]
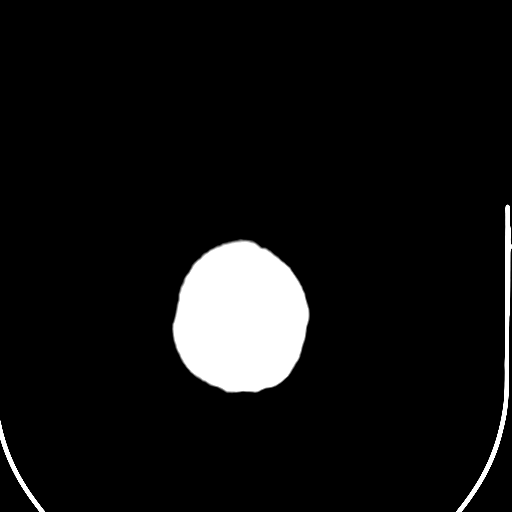

[Series 5: head 3.0 mpr cor · coronal · 0.33mm/px · 3 of 72 slices shown]
[im 24/72  brain]
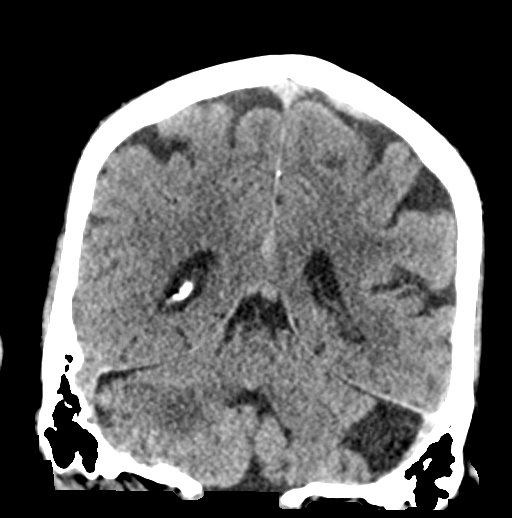
[im 32/72  brain]
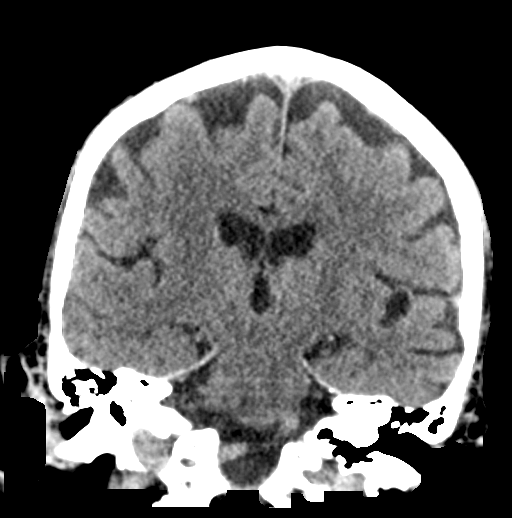
[im 40/72  brain]
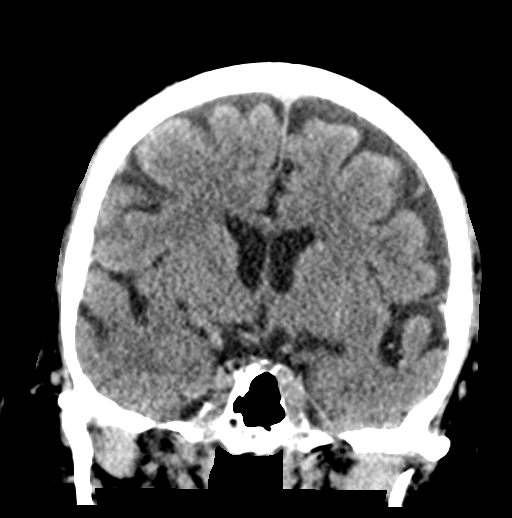

[Series 6: head 3.0 mpr sag · sagittal · 0.34mm/px · 3 of 66 slices shown]
[im 22/66  brain]
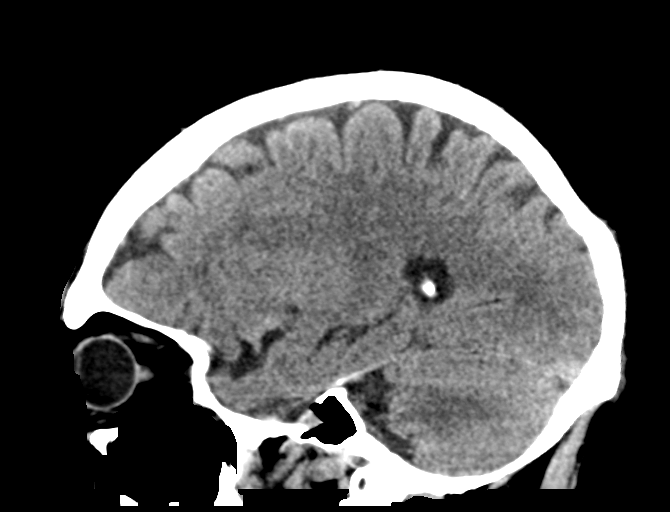
[im 33/66  brain]
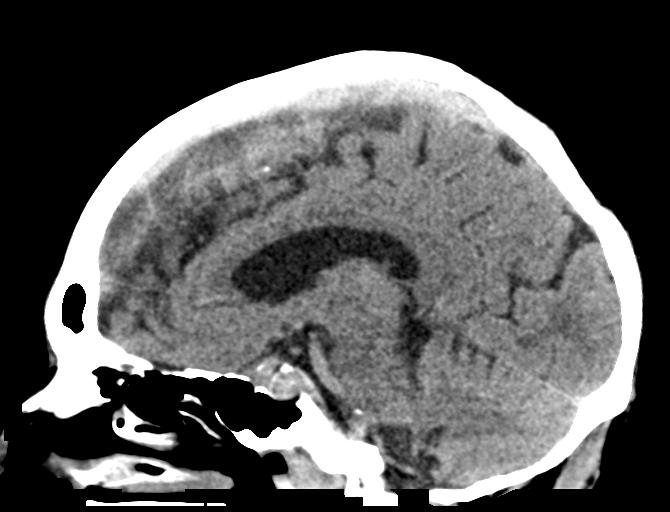
[im 44/66  brain]
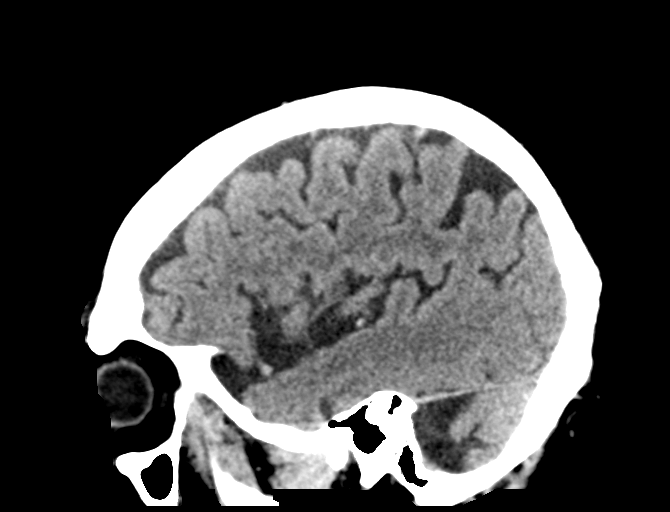

[16 of 47 positions shown; findings below may reference images not displayed]

FINDINGS: Brain: Mild atrophy and white matter changes are within normal
limits for age. No acute infarct, hemorrhage, or mass lesion is
present. The basal ganglia are intact. The insular ribbon is normal
bilaterally. No acute or focal cortical defects are present. The
brainstem and cerebellum are normal.

Vascular: No hyperdense vessel or unexpected calcification.

Skull: Normal. Negative for fracture or focal lesion.

Sinuses/Orbits: Minimal mucosal thickening is present floor of the
maxillary sinuses bilaterally. The paranasal sinuses an mastoid air
cells are otherwise clear. Bilateral lens replacements are present.
The globes and orbits are otherwise within normal limits.
IMPRESSION: 1. Normal CT appearance of the brain for age.
2. No acute or focal abnormality to explain the patient's dizziness
or nausea.

## 2019-10-09 DIAGNOSIS — Z79899 Other long term (current) drug therapy: Secondary | ICD-10-CM | POA: Diagnosis not present

## 2019-10-09 DIAGNOSIS — I10 Essential (primary) hypertension: Secondary | ICD-10-CM | POA: Diagnosis not present

## 2019-10-09 DIAGNOSIS — L989 Disorder of the skin and subcutaneous tissue, unspecified: Secondary | ICD-10-CM | POA: Diagnosis not present

## 2019-10-09 DIAGNOSIS — R011 Cardiac murmur, unspecified: Secondary | ICD-10-CM | POA: Diagnosis not present

## 2019-10-09 DIAGNOSIS — Z6826 Body mass index (BMI) 26.0-26.9, adult: Secondary | ICD-10-CM | POA: Diagnosis not present

## 2019-10-12 DIAGNOSIS — I10 Essential (primary) hypertension: Secondary | ICD-10-CM | POA: Diagnosis not present

## 2019-10-12 DIAGNOSIS — Z6826 Body mass index (BMI) 26.0-26.9, adult: Secondary | ICD-10-CM | POA: Diagnosis not present

## 2019-10-12 DIAGNOSIS — R011 Cardiac murmur, unspecified: Secondary | ICD-10-CM | POA: Diagnosis not present

## 2019-10-12 DIAGNOSIS — I351 Nonrheumatic aortic (valve) insufficiency: Secondary | ICD-10-CM | POA: Diagnosis not present

## 2019-10-23 DIAGNOSIS — I10 Essential (primary) hypertension: Secondary | ICD-10-CM | POA: Diagnosis not present

## 2019-10-23 DIAGNOSIS — Z0000 Encounter for general adult medical examination without abnormal findings: Secondary | ICD-10-CM | POA: Diagnosis not present

## 2019-10-23 DIAGNOSIS — Z6826 Body mass index (BMI) 26.0-26.9, adult: Secondary | ICD-10-CM | POA: Diagnosis not present

## 2019-11-02 DIAGNOSIS — Z6825 Body mass index (BMI) 25.0-25.9, adult: Secondary | ICD-10-CM | POA: Diagnosis not present

## 2019-11-02 DIAGNOSIS — I10 Essential (primary) hypertension: Secondary | ICD-10-CM | POA: Diagnosis not present

## 2019-12-25 DIAGNOSIS — H524 Presbyopia: Secondary | ICD-10-CM | POA: Diagnosis not present

## 2019-12-25 DIAGNOSIS — H43813 Vitreous degeneration, bilateral: Secondary | ICD-10-CM | POA: Diagnosis not present

## 2019-12-25 DIAGNOSIS — H26493 Other secondary cataract, bilateral: Secondary | ICD-10-CM | POA: Diagnosis not present

## 2019-12-25 DIAGNOSIS — H47293 Other optic atrophy, bilateral: Secondary | ICD-10-CM | POA: Diagnosis not present

## 2019-12-25 DIAGNOSIS — H43393 Other vitreous opacities, bilateral: Secondary | ICD-10-CM | POA: Diagnosis not present

## 2019-12-25 DIAGNOSIS — Z961 Presence of intraocular lens: Secondary | ICD-10-CM | POA: Diagnosis not present

## 2019-12-25 DIAGNOSIS — H40023 Open angle with borderline findings, high risk, bilateral: Secondary | ICD-10-CM | POA: Diagnosis not present

## 2020-06-03 DIAGNOSIS — Z1331 Encounter for screening for depression: Secondary | ICD-10-CM | POA: Diagnosis not present

## 2020-06-03 DIAGNOSIS — I10 Essential (primary) hypertension: Secondary | ICD-10-CM | POA: Diagnosis not present

## 2020-06-03 DIAGNOSIS — Z9181 History of falling: Secondary | ICD-10-CM | POA: Diagnosis not present

## 2020-06-03 DIAGNOSIS — K219 Gastro-esophageal reflux disease without esophagitis: Secondary | ICD-10-CM | POA: Diagnosis not present

## 2020-06-03 DIAGNOSIS — Z6825 Body mass index (BMI) 25.0-25.9, adult: Secondary | ICD-10-CM | POA: Diagnosis not present

## 2020-07-21 DIAGNOSIS — H26493 Other secondary cataract, bilateral: Secondary | ICD-10-CM | POA: Diagnosis not present

## 2020-07-21 DIAGNOSIS — H40023 Open angle with borderline findings, high risk, bilateral: Secondary | ICD-10-CM | POA: Diagnosis not present

## 2020-07-21 DIAGNOSIS — Z961 Presence of intraocular lens: Secondary | ICD-10-CM | POA: Diagnosis not present

## 2020-07-21 DIAGNOSIS — H43813 Vitreous degeneration, bilateral: Secondary | ICD-10-CM | POA: Diagnosis not present

## 2020-07-21 DIAGNOSIS — H47293 Other optic atrophy, bilateral: Secondary | ICD-10-CM | POA: Diagnosis not present

## 2020-07-21 DIAGNOSIS — H43393 Other vitreous opacities, bilateral: Secondary | ICD-10-CM | POA: Diagnosis not present

## 2020-11-03 DIAGNOSIS — Z0000 Encounter for general adult medical examination without abnormal findings: Secondary | ICD-10-CM | POA: Diagnosis not present

## 2020-11-03 DIAGNOSIS — H1010 Acute atopic conjunctivitis, unspecified eye: Secondary | ICD-10-CM | POA: Diagnosis not present

## 2020-11-03 DIAGNOSIS — Z6825 Body mass index (BMI) 25.0-25.9, adult: Secondary | ICD-10-CM | POA: Diagnosis not present

## 2021-01-28 DIAGNOSIS — H43813 Vitreous degeneration, bilateral: Secondary | ICD-10-CM | POA: Diagnosis not present

## 2021-01-28 DIAGNOSIS — H40053 Ocular hypertension, bilateral: Secondary | ICD-10-CM | POA: Diagnosis not present

## 2021-01-28 DIAGNOSIS — H43393 Other vitreous opacities, bilateral: Secondary | ICD-10-CM | POA: Diagnosis not present

## 2021-01-28 DIAGNOSIS — H26493 Other secondary cataract, bilateral: Secondary | ICD-10-CM | POA: Diagnosis not present

## 2021-01-28 DIAGNOSIS — H47293 Other optic atrophy, bilateral: Secondary | ICD-10-CM | POA: Diagnosis not present

## 2021-01-28 DIAGNOSIS — Z961 Presence of intraocular lens: Secondary | ICD-10-CM | POA: Diagnosis not present

## 2021-01-28 DIAGNOSIS — H3561 Retinal hemorrhage, right eye: Secondary | ICD-10-CM | POA: Diagnosis not present

## 2021-05-28 DIAGNOSIS — I43 Cardiomyopathy in diseases classified elsewhere: Secondary | ICD-10-CM | POA: Diagnosis not present

## 2021-05-28 DIAGNOSIS — Z6827 Body mass index (BMI) 27.0-27.9, adult: Secondary | ICD-10-CM | POA: Diagnosis not present

## 2021-05-28 DIAGNOSIS — Z23 Encounter for immunization: Secondary | ICD-10-CM | POA: Diagnosis not present

## 2021-05-28 DIAGNOSIS — K219 Gastro-esophageal reflux disease without esophagitis: Secondary | ICD-10-CM | POA: Diagnosis not present

## 2021-05-28 DIAGNOSIS — E782 Mixed hyperlipidemia: Secondary | ICD-10-CM | POA: Diagnosis not present

## 2021-05-28 DIAGNOSIS — I10 Essential (primary) hypertension: Secondary | ICD-10-CM | POA: Diagnosis not present

## 2021-05-28 DIAGNOSIS — Z79899 Other long term (current) drug therapy: Secondary | ICD-10-CM | POA: Diagnosis not present

## 2021-05-28 DIAGNOSIS — I110 Hypertensive heart disease with heart failure: Secondary | ICD-10-CM | POA: Diagnosis not present

## 2021-06-30 DIAGNOSIS — L293 Anogenital pruritus, unspecified: Secondary | ICD-10-CM | POA: Diagnosis not present

## 2021-07-27 DIAGNOSIS — H3561 Retinal hemorrhage, right eye: Secondary | ICD-10-CM | POA: Diagnosis not present

## 2021-07-27 DIAGNOSIS — H43813 Vitreous degeneration, bilateral: Secondary | ICD-10-CM | POA: Diagnosis not present

## 2021-07-27 DIAGNOSIS — H43393 Other vitreous opacities, bilateral: Secondary | ICD-10-CM | POA: Diagnosis not present

## 2021-07-27 DIAGNOSIS — H26493 Other secondary cataract, bilateral: Secondary | ICD-10-CM | POA: Diagnosis not present

## 2021-07-27 DIAGNOSIS — H40053 Ocular hypertension, bilateral: Secondary | ICD-10-CM | POA: Diagnosis not present

## 2021-07-27 DIAGNOSIS — H47293 Other optic atrophy, bilateral: Secondary | ICD-10-CM | POA: Diagnosis not present

## 2021-07-27 DIAGNOSIS — Z961 Presence of intraocular lens: Secondary | ICD-10-CM | POA: Diagnosis not present

## 2022-01-26 DIAGNOSIS — H43813 Vitreous degeneration, bilateral: Secondary | ICD-10-CM | POA: Diagnosis not present

## 2022-01-26 DIAGNOSIS — H26493 Other secondary cataract, bilateral: Secondary | ICD-10-CM | POA: Diagnosis not present

## 2022-01-26 DIAGNOSIS — Z961 Presence of intraocular lens: Secondary | ICD-10-CM | POA: Diagnosis not present

## 2022-01-26 DIAGNOSIS — H40053 Ocular hypertension, bilateral: Secondary | ICD-10-CM | POA: Diagnosis not present

## 2022-01-26 DIAGNOSIS — H3561 Retinal hemorrhage, right eye: Secondary | ICD-10-CM | POA: Diagnosis not present

## 2022-01-26 DIAGNOSIS — H47293 Other optic atrophy, bilateral: Secondary | ICD-10-CM | POA: Diagnosis not present

## 2022-01-26 DIAGNOSIS — H43393 Other vitreous opacities, bilateral: Secondary | ICD-10-CM | POA: Diagnosis not present

## 2022-07-06 DIAGNOSIS — Z6828 Body mass index (BMI) 28.0-28.9, adult: Secondary | ICD-10-CM | POA: Diagnosis not present

## 2022-07-06 DIAGNOSIS — K219 Gastro-esophageal reflux disease without esophagitis: Secondary | ICD-10-CM | POA: Diagnosis not present

## 2022-07-06 DIAGNOSIS — Z0000 Encounter for general adult medical examination without abnormal findings: Secondary | ICD-10-CM | POA: Diagnosis not present

## 2022-07-06 DIAGNOSIS — Z79899 Other long term (current) drug therapy: Secondary | ICD-10-CM | POA: Diagnosis not present

## 2022-07-06 DIAGNOSIS — Z23 Encounter for immunization: Secondary | ICD-10-CM | POA: Diagnosis not present

## 2022-08-02 DIAGNOSIS — H40053 Ocular hypertension, bilateral: Secondary | ICD-10-CM | POA: Diagnosis not present

## 2022-08-02 DIAGNOSIS — H43393 Other vitreous opacities, bilateral: Secondary | ICD-10-CM | POA: Diagnosis not present

## 2022-08-02 DIAGNOSIS — H43813 Vitreous degeneration, bilateral: Secondary | ICD-10-CM | POA: Diagnosis not present

## 2022-08-02 DIAGNOSIS — H3561 Retinal hemorrhage, right eye: Secondary | ICD-10-CM | POA: Diagnosis not present

## 2022-08-02 DIAGNOSIS — H26493 Other secondary cataract, bilateral: Secondary | ICD-10-CM | POA: Diagnosis not present

## 2022-08-02 DIAGNOSIS — H47293 Other optic atrophy, bilateral: Secondary | ICD-10-CM | POA: Diagnosis not present

## 2022-08-02 DIAGNOSIS — Z961 Presence of intraocular lens: Secondary | ICD-10-CM | POA: Diagnosis not present

## 2022-09-20 DIAGNOSIS — Z961 Presence of intraocular lens: Secondary | ICD-10-CM | POA: Diagnosis not present

## 2022-09-20 DIAGNOSIS — H43393 Other vitreous opacities, bilateral: Secondary | ICD-10-CM | POA: Diagnosis not present

## 2022-09-20 DIAGNOSIS — H3561 Retinal hemorrhage, right eye: Secondary | ICD-10-CM | POA: Diagnosis not present

## 2022-09-20 DIAGNOSIS — H43813 Vitreous degeneration, bilateral: Secondary | ICD-10-CM | POA: Diagnosis not present

## 2022-09-20 DIAGNOSIS — H40053 Ocular hypertension, bilateral: Secondary | ICD-10-CM | POA: Diagnosis not present

## 2022-09-20 DIAGNOSIS — H47293 Other optic atrophy, bilateral: Secondary | ICD-10-CM | POA: Diagnosis not present

## 2022-09-20 DIAGNOSIS — H26493 Other secondary cataract, bilateral: Secondary | ICD-10-CM | POA: Diagnosis not present

## 2022-10-19 DIAGNOSIS — R0981 Nasal congestion: Secondary | ICD-10-CM | POA: Diagnosis not present

## 2022-12-20 DIAGNOSIS — H43813 Vitreous degeneration, bilateral: Secondary | ICD-10-CM | POA: Diagnosis not present

## 2022-12-20 DIAGNOSIS — H26493 Other secondary cataract, bilateral: Secondary | ICD-10-CM | POA: Diagnosis not present

## 2022-12-20 DIAGNOSIS — Z961 Presence of intraocular lens: Secondary | ICD-10-CM | POA: Diagnosis not present

## 2022-12-20 DIAGNOSIS — H40053 Ocular hypertension, bilateral: Secondary | ICD-10-CM | POA: Diagnosis not present

## 2022-12-20 DIAGNOSIS — H47293 Other optic atrophy, bilateral: Secondary | ICD-10-CM | POA: Diagnosis not present

## 2022-12-20 DIAGNOSIS — H3561 Retinal hemorrhage, right eye: Secondary | ICD-10-CM | POA: Diagnosis not present

## 2022-12-20 DIAGNOSIS — H43393 Other vitreous opacities, bilateral: Secondary | ICD-10-CM | POA: Diagnosis not present

## 2023-06-21 DIAGNOSIS — H47293 Other optic atrophy, bilateral: Secondary | ICD-10-CM | POA: Diagnosis not present

## 2023-06-21 DIAGNOSIS — H43813 Vitreous degeneration, bilateral: Secondary | ICD-10-CM | POA: Diagnosis not present

## 2023-06-21 DIAGNOSIS — H3561 Retinal hemorrhage, right eye: Secondary | ICD-10-CM | POA: Diagnosis not present

## 2023-06-21 DIAGNOSIS — H43393 Other vitreous opacities, bilateral: Secondary | ICD-10-CM | POA: Diagnosis not present

## 2023-06-21 DIAGNOSIS — D23112 Other benign neoplasm of skin of right lower eyelid, including canthus: Secondary | ICD-10-CM | POA: Diagnosis not present

## 2023-06-21 DIAGNOSIS — Z961 Presence of intraocular lens: Secondary | ICD-10-CM | POA: Diagnosis not present

## 2023-06-21 DIAGNOSIS — H40053 Ocular hypertension, bilateral: Secondary | ICD-10-CM | POA: Diagnosis not present

## 2023-06-21 DIAGNOSIS — H26493 Other secondary cataract, bilateral: Secondary | ICD-10-CM | POA: Diagnosis not present

## 2023-06-21 DIAGNOSIS — H1132 Conjunctival hemorrhage, left eye: Secondary | ICD-10-CM | POA: Diagnosis not present

## 2023-08-18 DIAGNOSIS — Z79899 Other long term (current) drug therapy: Secondary | ICD-10-CM | POA: Diagnosis not present

## 2023-08-18 DIAGNOSIS — Z0000 Encounter for general adult medical examination without abnormal findings: Secondary | ICD-10-CM | POA: Diagnosis not present

## 2023-08-18 DIAGNOSIS — I110 Hypertensive heart disease with heart failure: Secondary | ICD-10-CM | POA: Diagnosis not present

## 2023-08-18 DIAGNOSIS — N401 Enlarged prostate with lower urinary tract symptoms: Secondary | ICD-10-CM | POA: Diagnosis not present

## 2023-08-18 DIAGNOSIS — E782 Mixed hyperlipidemia: Secondary | ICD-10-CM | POA: Diagnosis not present

## 2023-08-18 DIAGNOSIS — I10 Essential (primary) hypertension: Secondary | ICD-10-CM | POA: Diagnosis not present

## 2023-08-18 DIAGNOSIS — K219 Gastro-esophageal reflux disease without esophagitis: Secondary | ICD-10-CM | POA: Diagnosis not present

## 2023-08-18 DIAGNOSIS — E871 Hypo-osmolality and hyponatremia: Secondary | ICD-10-CM | POA: Diagnosis not present

## 2023-08-18 DIAGNOSIS — I43 Cardiomyopathy in diseases classified elsewhere: Secondary | ICD-10-CM | POA: Diagnosis not present

## 2023-10-11 DIAGNOSIS — H1132 Conjunctival hemorrhage, left eye: Secondary | ICD-10-CM | POA: Diagnosis not present

## 2023-10-11 DIAGNOSIS — H3561 Retinal hemorrhage, right eye: Secondary | ICD-10-CM | POA: Diagnosis not present

## 2023-10-11 DIAGNOSIS — H43813 Vitreous degeneration, bilateral: Secondary | ICD-10-CM | POA: Diagnosis not present

## 2023-10-11 DIAGNOSIS — H43393 Other vitreous opacities, bilateral: Secondary | ICD-10-CM | POA: Diagnosis not present

## 2023-10-11 DIAGNOSIS — D23112 Other benign neoplasm of skin of right lower eyelid, including canthus: Secondary | ICD-10-CM | POA: Diagnosis not present

## 2023-10-11 DIAGNOSIS — Z961 Presence of intraocular lens: Secondary | ICD-10-CM | POA: Diagnosis not present

## 2023-10-11 DIAGNOSIS — H26493 Other secondary cataract, bilateral: Secondary | ICD-10-CM | POA: Diagnosis not present

## 2023-10-11 DIAGNOSIS — H40053 Ocular hypertension, bilateral: Secondary | ICD-10-CM | POA: Diagnosis not present

## 2023-10-11 DIAGNOSIS — H47293 Other optic atrophy, bilateral: Secondary | ICD-10-CM | POA: Diagnosis not present

## 2023-12-07 ENCOUNTER — Encounter (HOSPITAL_BASED_OUTPATIENT_CLINIC_OR_DEPARTMENT_OTHER): Payer: Self-pay | Admitting: Emergency Medicine

## 2023-12-07 ENCOUNTER — Other Ambulatory Visit: Payer: Self-pay

## 2023-12-07 ENCOUNTER — Inpatient Hospital Stay (HOSPITAL_BASED_OUTPATIENT_CLINIC_OR_DEPARTMENT_OTHER)
Admission: EM | Admit: 2023-12-07 | Discharge: 2023-12-10 | DRG: 378 | Disposition: A | Discharge status: Home / Self Care | Attending: Internal Medicine | Admitting: Internal Medicine

## 2023-12-07 ENCOUNTER — Emergency Department (HOSPITAL_BASED_OUTPATIENT_CLINIC_OR_DEPARTMENT_OTHER)

## 2023-12-07 DIAGNOSIS — Q600 Renal agenesis, unilateral: Secondary | ICD-10-CM | POA: Diagnosis not present

## 2023-12-07 DIAGNOSIS — K5730 Diverticulosis of large intestine without perforation or abscess without bleeding: Secondary | ICD-10-CM | POA: Diagnosis present

## 2023-12-07 DIAGNOSIS — K2970 Gastritis, unspecified, without bleeding: Secondary | ICD-10-CM | POA: Diagnosis present

## 2023-12-07 DIAGNOSIS — K3189 Other diseases of stomach and duodenum: Secondary | ICD-10-CM | POA: Diagnosis present

## 2023-12-07 DIAGNOSIS — Z86006 Personal history of melanoma in-situ: Secondary | ICD-10-CM | POA: Diagnosis not present

## 2023-12-07 DIAGNOSIS — K529 Noninfective gastroenteritis and colitis, unspecified: Secondary | ICD-10-CM | POA: Diagnosis not present

## 2023-12-07 DIAGNOSIS — D62 Acute posthemorrhagic anemia: Secondary | ICD-10-CM | POA: Diagnosis not present

## 2023-12-07 DIAGNOSIS — Z880 Allergy status to penicillin: Secondary | ICD-10-CM | POA: Diagnosis not present

## 2023-12-07 DIAGNOSIS — K317 Polyp of stomach and duodenum: Secondary | ICD-10-CM | POA: Diagnosis not present

## 2023-12-07 DIAGNOSIS — K2289 Other specified disease of esophagus: Secondary | ICD-10-CM | POA: Diagnosis present

## 2023-12-07 DIAGNOSIS — N400 Enlarged prostate without lower urinary tract symptoms: Secondary | ICD-10-CM | POA: Diagnosis present

## 2023-12-07 DIAGNOSIS — K449 Diaphragmatic hernia without obstruction or gangrene: Secondary | ICD-10-CM

## 2023-12-07 DIAGNOSIS — E876 Hypokalemia: Secondary | ICD-10-CM | POA: Diagnosis present

## 2023-12-07 DIAGNOSIS — R112 Nausea with vomiting, unspecified: Secondary | ICD-10-CM | POA: Diagnosis not present

## 2023-12-07 DIAGNOSIS — Z8042 Family history of malignant neoplasm of prostate: Secondary | ICD-10-CM

## 2023-12-07 DIAGNOSIS — K8020 Calculus of gallbladder without cholecystitis without obstruction: Secondary | ICD-10-CM | POA: Diagnosis not present

## 2023-12-07 DIAGNOSIS — I10 Essential (primary) hypertension: Secondary | ICD-10-CM | POA: Diagnosis present

## 2023-12-07 DIAGNOSIS — K920 Hematemesis: Secondary | ICD-10-CM | POA: Diagnosis not present

## 2023-12-07 DIAGNOSIS — Z801 Family history of malignant neoplasm of trachea, bronchus and lung: Secondary | ICD-10-CM

## 2023-12-07 DIAGNOSIS — E871 Hypo-osmolality and hyponatremia: Secondary | ICD-10-CM | POA: Diagnosis not present

## 2023-12-07 DIAGNOSIS — A059 Bacterial foodborne intoxication, unspecified: Secondary | ICD-10-CM | POA: Diagnosis present

## 2023-12-07 DIAGNOSIS — K5710 Diverticulosis of small intestine without perforation or abscess without bleeding: Secondary | ICD-10-CM | POA: Diagnosis not present

## 2023-12-07 DIAGNOSIS — K219 Gastro-esophageal reflux disease without esophagitis: Secondary | ICD-10-CM | POA: Diagnosis not present

## 2023-12-07 DIAGNOSIS — R066 Hiccough: Secondary | ICD-10-CM | POA: Diagnosis present

## 2023-12-07 DIAGNOSIS — Z833 Family history of diabetes mellitus: Secondary | ICD-10-CM

## 2023-12-07 DIAGNOSIS — E785 Hyperlipidemia, unspecified: Secondary | ICD-10-CM | POA: Diagnosis not present

## 2023-12-07 DIAGNOSIS — I700 Atherosclerosis of aorta: Secondary | ICD-10-CM | POA: Diagnosis not present

## 2023-12-07 DIAGNOSIS — Z888 Allergy status to other drugs, medicaments and biological substances status: Secondary | ICD-10-CM | POA: Diagnosis not present

## 2023-12-07 DIAGNOSIS — K922 Gastrointestinal hemorrhage, unspecified: Secondary | ICD-10-CM

## 2023-12-07 DIAGNOSIS — Z79899 Other long term (current) drug therapy: Secondary | ICD-10-CM

## 2023-12-07 DIAGNOSIS — D131 Benign neoplasm of stomach: Secondary | ICD-10-CM | POA: Diagnosis not present

## 2023-12-07 LAB — PROTIME-INR
INR: 1.1 (ref 0.8–1.2)
Prothrombin Time: 14.4 s (ref 11.4–15.2)

## 2023-12-07 LAB — COMPREHENSIVE METABOLIC PANEL WITH GFR
ALT: 11 U/L (ref 0–44)
AST: 36 U/L (ref 15–41)
Albumin: 3.7 g/dL (ref 3.5–5.0)
Alkaline Phosphatase: 36 U/L — ABNORMAL LOW (ref 38–126)
Anion gap: 10 (ref 5–15)
BUN: 25 mg/dL — ABNORMAL HIGH (ref 8–23)
CO2: 27 mmol/L (ref 22–32)
Calcium: 9.4 mg/dL (ref 8.9–10.3)
Chloride: 88 mmol/L — ABNORMAL LOW (ref 98–111)
Creatinine, Ser: 1.1 mg/dL (ref 0.61–1.24)
GFR, Estimated: >60 mL/min (ref >60)
Glucose, Bld: 138 mg/dL — ABNORMAL HIGH (ref 70–99)
Potassium: 3.3 mmol/L — ABNORMAL LOW (ref 3.5–5.1)
Sodium: 125 mmol/L — ABNORMAL LOW (ref 135–145)
Total Bilirubin: 0.7 mg/dL (ref 0.0–1.2)
Total Protein: 6.7 g/dL (ref 6.5–8.1)

## 2023-12-07 LAB — BASIC METABOLIC PANEL WITH GFR
Anion gap: 8 (ref 5–15)
BUN: 30 mg/dL — ABNORMAL HIGH (ref 8–23)
CO2: 27 mmol/L (ref 22–32)
Calcium: 8.9 mg/dL (ref 8.9–10.3)
Chloride: 88 mmol/L — ABNORMAL LOW (ref 98–111)
Creatinine, Ser: 1.07 mg/dL (ref 0.61–1.24)
GFR, Estimated: >60 mL/min (ref >60)
Glucose, Bld: 146 mg/dL — ABNORMAL HIGH (ref 70–99)
Potassium: 3.1 mmol/L — ABNORMAL LOW (ref 3.5–5.1)
Sodium: 123 mmol/L — ABNORMAL LOW (ref 135–145)

## 2023-12-07 LAB — CBC WITH DIFFERENTIAL/PLATELET
Abs Immature Granulocytes: 0.06 10*3/uL (ref 0.00–0.07)
Basophils Absolute: 0 10*3/uL (ref 0.0–0.1)
Basophils Relative: 0 %
Eosinophils Absolute: 0 10*3/uL (ref 0.0–0.5)
Eosinophils Relative: 0 %
HCT: 37.4 % — ABNORMAL LOW (ref 39.0–52.0)
Hemoglobin: 13 g/dL (ref 13.0–17.0)
Immature Granulocytes: 1 %
Lymphocytes Relative: 16 %
Lymphs Abs: 1.9 10*3/uL (ref 0.7–4.0)
MCH: 29.2 pg (ref 26.0–34.0)
MCHC: 34.8 g/dL (ref 30.0–36.0)
MCV: 84 fL (ref 80.0–100.0)
Monocytes Absolute: 1 10*3/uL (ref 0.1–1.0)
Monocytes Relative: 8 %
Neutro Abs: 9.4 10*3/uL — ABNORMAL HIGH (ref 1.7–7.7)
Neutrophils Relative %: 75 %
Platelets: 256 10*3/uL (ref 150–400)
RBC: 4.45 MIL/uL (ref 4.22–5.81)
RDW: 14.1 % (ref 11.5–15.5)
WBC: 12.5 10*3/uL — ABNORMAL HIGH (ref 4.0–10.5)
nRBC: 0 % (ref 0.0–0.2)

## 2023-12-07 LAB — CBC
HCT: 39.2 % (ref 39.0–52.0)
Hemoglobin: 13.1 g/dL (ref 13.0–17.0)
MCH: 29.5 pg (ref 26.0–34.0)
MCHC: 33.4 g/dL (ref 30.0–36.0)
MCV: 88.3 fL (ref 80.0–100.0)
Platelets: 245 10*3/uL (ref 150–400)
RBC: 4.44 MIL/uL (ref 4.22–5.81)
RDW: 14.1 % (ref 11.5–15.5)
WBC: 13 10*3/uL — ABNORMAL HIGH (ref 4.0–10.5)
nRBC: 0 % (ref 0.0–0.2)

## 2023-12-07 LAB — SODIUM: Sodium: 125 mmol/L — ABNORMAL LOW (ref 135–145)

## 2023-12-07 LAB — TYPE AND SCREEN
ABO/RH(D): O POS
Antibody Screen: NEGATIVE

## 2023-12-07 LAB — TROPONIN I (HIGH SENSITIVITY)
Troponin I (High Sensitivity): 17 ng/L (ref <18)
Troponin I (High Sensitivity): 19 ng/L — ABNORMAL HIGH (ref <18)

## 2023-12-07 LAB — LIPASE, BLOOD: Lipase: 12 U/L (ref 11–51)

## 2023-12-07 LAB — ABO/RH: ABO/RH(D): O POS

## 2023-12-07 LAB — SODIUM, URINE, RANDOM: Sodium, Ur: <10 mmol/L

## 2023-12-07 MED ORDER — SODIUM CHLORIDE 0.9 % IV SOLN
Freq: Once | INTRAVENOUS | Status: AC
Start: 2023-12-07 — End: 2023-12-08

## 2023-12-07 MED ORDER — SODIUM CHLORIDE 0.9 % IV SOLN
2.0000 g | INTRAVENOUS | Status: DC
  Administered 2023-12-08: 2 g via INTRAVENOUS
  Filled 2023-12-07: qty 20

## 2023-12-07 MED ORDER — ADULT MULTIVITAMIN W/MINERALS CH
1.0000 | ORAL_TABLET | Freq: Every day | ORAL | Status: DC
  Administered 2023-12-08 – 2023-12-10 (×3): 1 via ORAL
  Filled 2023-12-07 (×3): qty 1

## 2023-12-07 MED ORDER — METOCLOPRAMIDE HCL 5 MG/ML IJ SOLN
5.0000 mg | Freq: Three times a day (TID) | INTRAMUSCULAR | Status: DC | PRN
  Administered 2023-12-08 – 2023-12-09 (×2): 5 mg via INTRAVENOUS
  Filled 2023-12-07 (×2): qty 2

## 2023-12-07 MED ORDER — LACTATED RINGERS IV SOLN: INTRAVENOUS | Status: AC

## 2023-12-07 MED ORDER — ONDANSETRON HCL 4 MG/2ML IJ SOLN
4.0000 mg | Freq: Once | INTRAMUSCULAR | Status: AC
  Administered 2023-12-07: 4 mg via INTRAVENOUS

## 2023-12-07 MED ORDER — LATANOPROST 0.005 % OP SOLN
1.0000 [drp] | Freq: Every day | OPHTHALMIC | Status: DC
  Administered 2023-12-07 – 2023-12-08 (×2): 1 [drp] via OPHTHALMIC
  Filled 2023-12-07: qty 2.5

## 2023-12-07 MED ORDER — SODIUM CHLORIDE 0.9% FLUSH
3.0000 mL | Freq: Two times a day (BID) | INTRAVENOUS | Status: DC
  Administered 2023-12-07 – 2023-12-10 (×3): 3 mL via INTRAVENOUS

## 2023-12-07 MED ORDER — POTASSIUM CHLORIDE 20 MEQ PO PACK
60.0000 meq | PACK | Freq: Once | ORAL | Status: AC
  Administered 2023-12-07: 60 meq via ORAL
  Filled 2023-12-07: qty 3

## 2023-12-07 MED ORDER — SODIUM CHLORIDE 0.9 % IV BOLUS
1000.0000 mL | Freq: Once | INTRAVENOUS | Status: AC
  Administered 2023-12-07: 1000 mL via INTRAVENOUS

## 2023-12-07 MED ORDER — ACETAMINOPHEN 325 MG PO TABS: 650.0000 mg | ORAL_TABLET | Freq: Four times a day (QID) | ORAL | Status: DC | PRN

## 2023-12-07 MED ORDER — CARVEDILOL 25 MG PO TABS: 25.0000 mg | ORAL_TABLET | Freq: Two times a day (BID) | ORAL | Status: DC

## 2023-12-07 MED ORDER — SODIUM CHLORIDE 0.9 % IV SOLN
2.0000 g | Freq: Once | INTRAVENOUS | Status: AC
  Administered 2023-12-07: 2 g via INTRAVENOUS
  Filled 2023-12-07: qty 20

## 2023-12-07 MED ORDER — PANTOPRAZOLE SODIUM 40 MG IV SOLR
40.0000 mg | Freq: Two times a day (BID) | INTRAVENOUS | Status: DC
  Administered 2023-12-07 – 2023-12-09 (×4): 40 mg via INTRAVENOUS
  Filled 2023-12-07 (×4): qty 10

## 2023-12-07 MED ORDER — POLYETHYLENE GLYCOL 3350 17 G PO PACK: 17.0000 g | PACK | Freq: Every day | ORAL | Status: DC | PRN

## 2023-12-07 MED ORDER — FINASTERIDE 5 MG PO TABS
5.0000 mg | ORAL_TABLET | Freq: Every day | ORAL | Status: DC
Start: 2023-12-08 — End: 2023-12-10
  Administered 2023-12-08 – 2023-12-10 (×3): 5 mg via ORAL
  Filled 2023-12-07 (×3): qty 1

## 2023-12-07 MED ORDER — IOHEXOL 350 MG/ML SOLN
100.0000 mL | Freq: Once | INTRAVENOUS | Status: AC | PRN
  Administered 2023-12-07: 100 mL via INTRAVENOUS

## 2023-12-07 MED ORDER — METRONIDAZOLE 500 MG/100ML IV SOLN
500.0000 mg | Freq: Two times a day (BID) | INTRAVENOUS | Status: DC
  Administered 2023-12-08 (×3): 500 mg via INTRAVENOUS
  Filled 2023-12-07 (×3): qty 100

## 2023-12-07 MED ORDER — METRONIDAZOLE 500 MG/100ML IV SOLN
500.0000 mg | Freq: Once | INTRAVENOUS | Status: AC
  Administered 2023-12-07: 500 mg via INTRAVENOUS
  Filled 2023-12-07: qty 100

## 2023-12-07 MED ORDER — POTASSIUM CHLORIDE 20 MEQ PO PACK: 60.0000 meq | PACK | Freq: Once | ORAL | Status: DC

## 2023-12-07 MED ORDER — LACTATED RINGERS IV SOLN: INTRAVENOUS | Status: DC

## 2023-12-07 MED ORDER — POTASSIUM CHLORIDE 20 MEQ PO PACK
40.0000 meq | PACK | Freq: Once | ORAL | Status: AC
  Administered 2023-12-07: 40 meq via ORAL
  Filled 2023-12-07: qty 2

## 2023-12-07 MED ORDER — ONDANSETRON HCL 4 MG/2ML IJ SOLN
4.0000 mg | Freq: Once | INTRAMUSCULAR | Status: DC
  Filled 2023-12-07: qty 2

## 2023-12-07 MED ORDER — ACETAMINOPHEN 650 MG RE SUPP: 650.0000 mg | Freq: Four times a day (QID) | RECTAL | Status: DC | PRN

## 2023-12-07 NOTE — Assessment & Plan Note (Signed)
 based on history of 4 loose bowel movements a day before yesterday, since then patient reports no further diarrhea.  Clinically speaking I think it has resolved, patient already received ceftriaxone and metronidazole in the ER, will continue same for the next few days.

## 2023-12-07 NOTE — Assessment & Plan Note (Addendum)
 Patient seems to be having upper GI bleed since last evening.  With major episode last evening reported as very dark emesis.  Since then only minimal emesis black.  Check type and screen, pantoprazole.  No suspicion of portal htn.  I will hold off on patient hypertensive agents except for Coreg.  Restart as able in the morning.  I have sent a message to [facility] to kindly evaluate the patient in the morning.  He has previously had hematochezia and seen [facility] for same. Check stool for h pylori

## 2023-12-07 NOTE — ED Provider Notes (Signed)
 [place] [facility]GENC[facility]MENT AT [facility] [place] Provider Note   CSN: [id number] Arrival date & time: 12/07/23  0802     History  Chief Complaint  Patient presents with   Hematemesis    Donald Ball is a 88 y.o. male.  Patient here with diarrhea, body aches, fatigue, emesis today.  Maybe was darker than normal.  He has been taking some Pepto-Bismol earlier this week with his diarrhea.  Diarrhea is improved.  No recent antibiotics.  Has had some hiccups with history of the same.  Denies any specific chest pain.  He is had some abdominal bloating.  He has had some decrease stool output.  He threw up last about 12 hours ago.  Denies any fever or chills.  No sick contacts.  No suspicious food intake.  No abdominal surgery history.  He is not on any blood thinners.  He has not had any dark or bloody stools.  The history is provided by the patient.       Home Medications Prior to Admission medications   Medication Sig Start Date End Date Taking? Authorizing Provider  carvedilol (COREG) 25 MG tablet Take 25 mg by mouth 2 (two) times daily with a meal.   Yes [provider]  doxazosin (CARDURA) 4 MG tablet Take 4 mg by mouth daily. *mid morning*   Yes [provider]  doxazosin (CARDURA) 8 MG tablet Take 8 mg by mouth daily.   Yes [provider]  finasteride (PROSCAR) 5 MG tablet Take 5 mg by mouth daily.   Yes [provider]  latanoprost (XALATAN) 0.005 % ophthalmic solution Place 1 drop into both eyes at bedtime. 09/13/23  Yes [provider]  Multiple Vitamin (MULTIVITAMIN) tablet Take 1 tablet by mouth daily.   Yes [provider]  olmesartan-hydrochlorothiazide (BENICAR HCT) 40-12.5 MG tablet Take 1 tablet by mouth daily.   Yes [provider]  omeprazole (PRILOSEC) 20 MG capsule Take 20 mg by mouth 2 (two) times daily before a meal.    Yes [provider]  vitamin C (ASCORBIC ACID) 500 MG tablet Take  500 mg by mouth daily.   Yes [provider]      Allergies    Altace [ramipril], Hydralazine, Nisoldipine, Norvasc [amlodipine besylate], Penicillins, and Sular [nisoldipine er]    Review of Systems   Review of Systems  Physical Exam Updated Vital Signs BP (!) 164/108   Pulse 78   Temp 97.6 F (36.4 C) (Oral)   Resp 20   Ht 5\' 11"  (1.803 m)   Wt 105.7 kg   SpO2 92%   BMI 32.50 kg/m  Physical Exam Vitals and nursing note reviewed.  Constitutional:      General: He is not in acute distress.    Appearance: He is well-developed. He is not ill-appearing.  HENT:     Head: Normocephalic and atraumatic.     Nose: Nose normal.     Mouth/Throat:     Mouth: Mucous membranes are moist.  Eyes:     Extraocular Movements: Extraocular movements intact.     Conjunctiva/sclera: Conjunctivae normal.     Pupils: Pupils are equal, round, and reactive to light.  Cardiovascular:     Rate and Rhythm: Normal rate and regular rhythm.     Pulses: Normal pulses.     Heart sounds: Normal heart sounds. No murmur heard. Pulmonary:     Effort: Pulmonary effort is normal. No respiratory distress.     Breath sounds:  Normal breath sounds.  Abdominal:     General: Abdomen is flat. There is distension.     Palpations: Abdomen is soft.     Tenderness: There is abdominal tenderness.  Musculoskeletal:        General: No swelling.     Cervical back: Normal range of motion and neck supple.  Skin:    General: Skin is warm and dry.     Capillary Refill: Capillary refill takes less than 2 seconds.  Neurological:     General: No focal deficit present.     Mental Status: He is alert.  Psychiatric:        Mood and Affect: Mood normal.     ED Results / Procedures / Treatments   Labs (all labs ordered are listed, but only abnormal results are displayed) Labs Reviewed  CBC WITH DIFFERENTIAL/PLATELET - Abnormal; Notable for the following components:      Result Value   WBC 12.5 (*)    HCT  37.4 (*)    Neutro Abs 9.4 (*)    All other components within normal limits  COMPREHENSIVE METABOLIC PANEL WITH GFR - Abnormal; Notable for the following components:   Sodium 125 (*)    Potassium 3.3 (*)    Chloride 88 (*)    Glucose, Bld 138 (*)    BUN 25 (*)    Alkaline Phosphatase 36 (*)    All other components within normal limits  SODIUM - Abnormal; Notable for the following components:   Sodium 125 (*)    All other components within normal limits  TROPONIN I (HIGH SENSITIVITY) - Abnormal; Notable for the following components:   Troponin I (High Sensitivity) 19 (*)    All other components within normal limits  LIPASE, BLOOD  TROPONIN I (HIGH SENSITIVITY)    EKG EKG Interpretation Date/Time:  Wednesday December 07 2023 08:40:56 EDT Ventricular Rate:  72 PR Interval:  223 QRS Duration:  97 QT Interval:  398 QTC Calculation: 436 R Axis:   -35  Text Interpretation: Sinus rhythm Atrial premature complex Confirmed by Virgina Norfolk [phone]) on 12/07/2023 8:55:47 AM  Radiology CT Angio Chest/Abd/Pel for Dissection W and/or Wo Contrast Result Date: 12/07/2023 CLINICAL DATA:  Acute aortic syndrome suspected, diarrhea, fatigue, achiness, dark emesis EXAM: CT ANGIOGRAPHY CHEST, ABDOMEN AND PELVIS TECHNIQUE: Non-contrast CT of the chest was initially obtained. Multidetector CT imaging through the chest, abdomen and pelvis was performed using the standard protocol during bolus administration of intravenous contrast. Multiplanar reconstructed images and MIPs were obtained and reviewed to evaluate the vascular anatomy. RADIATION DOSE REDUCTION: This exam was performed according to the departmental dose-optimization program which includes automated exposure control, adjustment of the mA and/or kV according to patient size and/or use of iterative reconstruction technique. CONTRAST:  OMNIPAQUE IOHEXOL 350 MG/ML SOLN COMPARISON:  CT abdomen pelvis, 02/12/2017 FINDINGS: CTA CHEST FINDINGS VASCULAR  Aorta: Satisfactory opacification of the aorta. Normal contour and caliber of the thoracic aorta. No evidence of aneurysm, dissection, or other acute aortic pathology. Mild aortic atherosclerosis. Cardiovascular: No evidence of pulmonary embolism on limited non-tailored examination. Normal heart size. Mild cardiomegaly. No pericardial effusion. Review of the MIP images confirms the above findings. NON VASCULAR Mediastinum/Nodes: No enlarged mediastinal, hilar, or axillary lymph nodes. Small hiatal hernia. Thyroid gland, trachea, and esophagus demonstrate no significant findings. Lungs/Pleura: Mild bibasilar scarring. Multiple small definitively benign calcified pulmonary nodules, requiring no further follow-up or characterization. No pleural effusion or pneumothorax. Musculoskeletal: No chest wall abnormality. No acute osseous  findings. Review of the MIP images confirms the above findings. CTA ABDOMEN AND PELVIS FINDINGS VASCULAR Normal contour and caliber of the abdominal aorta. Mild mixed calcific atherosclerosis. No evidence of aneurysm, dissection, or other acute aortic pathology. Standard branching pattern of the abdominal aorta with solitary bilateral renal arteries. Review of the MIP images confirms the above findings. NON-VASCULAR Hepatobiliary: No solid liver abnormality is seen. Hepatic steatosis. Small gallstones. No gallbladder wall thickening, or biliary dilatation. Pancreas: Unremarkable. No pancreatic ductal dilatation or surrounding inflammatory changes. Spleen: Normal in size without significant abnormality. Adrenals/Urinary Tract: Adrenal glands are unremarkable. Kidneys are normal, without renal calculi, solid lesion, or hydronephrosis. Bladder is unremarkable. Stomach/Bowel: Stomach is within normal limits. Diverticulum of the transverse duodenum. Appendix not clearly visualized. Fluid-filled, although nondistended loops of small bowel throughout the mid abdomen (series 4, image 174). No evidence  of bowel wall thickening, distention, or inflammatory changes. Severe pancolonic diverticulosis. Lymphatic: No enlarged abdominal or pelvic lymph nodes. Reproductive: Prostatomegaly. Other: No abdominal wall hernia or abnormality. No ascites. Musculoskeletal: No acute osseous findings. 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 Normal contour and caliber of the thoracic and abdominal aorta. No evidence of aneurysm, dissection, or other acute aortic pathology. Mild aortic atherosclerosis. 2. Fluid-filled, nondistended loops of small bowel throughout the mid abdomen, suggesting nonspecific enteritis. No evidence of bowel obstruction. Gas and stool present to the rectum. 3. Severe pancolonic diverticulosis without evidence of acute diverticulitis. 4. Hepatic steatosis. 5. Cholelithiasis without evidence of acute cholecystitis. Aortic Atherosclerosis (ICD10-I70.0). Electronically Signed   By: Jearld Lesch M.D.   On: 12/07/2023 10:40    Procedures .Critical Care  Performed by: Virgina Norfolk, DO Authorized by: Virgina Norfolk, DO   Critical care provider statement:    Critical care time (minutes):  35   Critical care was necessary to treat or prevent imminent or life-threatening deterioration of the following conditions: hyponatremia.   Critical care was time spent personally by me on the following activities:  Blood draw for specimens, development of treatment plan with patient or surrogate, discussions with primary provider, evaluation of patient's response to treatment, examination of patient, obtaining history from patient or surrogate, ordering and performing treatments and interventions, ordering and review of laboratory studies, ordering and review of radiographic studies, pulse oximetry, re-evaluation of patient's condition and review of old charts   Care discussed with: admitting provider       Medications Ordered in ED Medications   ondansetron (ZOFRAN) injection 4 mg (4 mg Intravenous Not Given 12/07/23 0957)  sodium chloride 0.9 % bolus 1,000 mL (1,000 mLs Intravenous New Bag/Given 12/07/23 0853)  iohexol (OMNIPAQUE) 350 MG/ML injection 100 mL (100 mLs Intravenous Contrast Given 12/07/23 0938)  ondansetron (ZOFRAN) injection 4 mg (4 mg Intravenous Given 12/07/23 1116)  0.9 %  sodium chloride infusion ( Intravenous New Bag/Given 12/07/23 1228)  cefTRIAXone (ROCEPHIN) 2 g in sodium chloride 0.9 % 100 mL IVPB (0 g Intravenous Stopped 12/07/23 1307)  metroNIDAZOLE (FLAGYL) IVPB 500 mg (500 mg Intravenous New Bag/Given 12/07/23 1305)    ED Course/ Medical Decision Making/ A&P                                 Medical Decision Making Amount and/or Complexity of Data Reviewed Labs: ordered. Radiology: ordered.  Risk Prescription drug management. Decision regarding hospitalization.   TALI COSTER is here with nausea, hiccups, blood  in vomit.  Normal vitals except for mild hypertension.  History of high blood pressure, reflux.  Differential includes possibly colitis seems less likely to be GI bleed, dissection, ACS, bowel obstruction.  Will get CBC CMP lipase troponin EKG CT dissection study to further evaluate intrathoracic, intra-abdominal process.  Is not having any melena or hematochezia.  He threw up dark vomit once about 12 hours ago.  Will give IV fluids IV Zofran and reevaluate.  Per review and interprettion of labs, sodium 125, likely from hypovolemia. CT per radiology with enteritis, no other acute processes, will admit for hydration, antibioitcs. No concern for sepsis. Labs otherwise unremarkable. Admitted to hospital for further care.  This chart was dictated using voice recognition software.  Despite best efforts to proofread,  errors can occur which can change the documentation meaning.         Final Clinical Impression(s) / ED Diagnoses Final diagnoses:  Enteritis  Hyponatremia    Rx / DC Orders ED  Discharge Orders     None         Virgina Norfolk, DO 12/07/23 1754

## 2023-12-07 NOTE — ED Triage Notes (Signed)
 Monday on diarrhea (no blood or dark stools). Felt bad all day yesterday, (fatigue, achiness), pt then vomited dark emesis x1.

## 2023-12-07 NOTE — ED Notes (Signed)
Donald Ball with cl called for transport

## 2023-12-07 NOTE — H&P (Signed)
 History and Physical    Patient: Donald Ball:[id number] DOB: 12/29/1935 DOA: 12/07/2023 DOS: the patient was seen and examined on 12/07/2023 PCP: Lise Auer, MD  Patient coming from: Home  Chief Complaint:  Chief Complaint  Patient presents with   Hematemesis   HPI: Donald Ball is a 88 y.o. male with medical history significant of medical issues as listed below. [name] was in his usuals state of health till 2 days ago when he had new onset diarrhea 4 episodes of watery brown BM. No abd pain or fever. Seems to have since then resolved. However, patient subequently had episode of "very dark" emesis last evening. He has since then c.w. having hiccups (to whch he is prone) and spitting up black streaks of stuff.  No melena, no gum/nose bleeding. [name] initially presented to [facility] ER. Transferred to [facility] inpatient unit.   [name] is curently comfortable. Review of Systems: As mentioned in the history of present illness. All other systems reviewed and are negative. Past Medical History:  Diagnosis Date   Anemia associated with acute blood loss    Benign essential hypertension    BPH (benign prostatic hyperplasia)    Diverticulosis    GERD (gastroesophageal reflux disease)    GI bleed    Hyperlipidemia    Melanoma in situ of left ear [facility])    Past Surgical History:  Procedure Laterality Date   CATARACT EXTRACTION     Social History:  reports that he has never smoked. He has never used smokeless tobacco. He reports that he does not drink alcohol and does not use drugs.  Allergies  Allergen Reactions   Altace [Ramipril]    Hydralazine    Nisoldipine     REACTION: skin turns red   Norvasc [Amlodipine Besylate]    Penicillins     REACTION: blisters   Sular [Nisoldipine Er]     Family History  Problem Relation Age of Onset   Lung cancer Father    Other Mother        died from old age   Diabetes Sister    Diabetes Brother    Prostate cancer Brother    Colon  cancer Neg Hx    Stomach cancer Neg Hx    Rectal cancer Neg Hx    Esophageal cancer Neg Hx    Liver cancer Neg Hx     Prior to Admission medications   Medication Sig Start Date End Date Taking? Authorizing Provider  carvedilol (COREG) 25 MG tablet Take 25 mg by mouth 2 (two) times daily with a meal.   Yes [provider]  doxazosin (CARDURA) 4 MG tablet Take 4 mg by mouth daily. *mid morning*   Yes [provider]  doxazosin (CARDURA) 8 MG tablet Take 8 mg by mouth daily.   Yes [provider]  finasteride (PROSCAR) 5 MG tablet Take 5 mg by mouth daily.   Yes [provider]  latanoprost (XALATAN) 0.005 % ophthalmic solution Place 1 drop into both eyes at bedtime. 09/13/23  Yes [provider]  Multiple Vitamin (MULTIVITAMIN) tablet Take 1 tablet by mouth daily.   Yes [provider]  olmesartan-hydrochlorothiazide (BENICAR HCT) 40-12.5 MG tablet Take 1 tablet by mouth daily.   Yes [provider]  omeprazole (PRILOSEC) 20 MG capsule Take 20 mg by mouth 2 (two) times daily before a meal.    Yes [provider]  vitamin C (ASCORBIC ACID) 500 MG tablet Take 500 mg by mouth  daily.   Yes [provider]    Physical Exam: Vitals:   12/07/23 1300 12/07/23 1408 12/07/23 1600 12/07/23 1806  BP:  (!) 153/86 (!) 164/108 (!) 166/94  Pulse: 77 81 78 84  Resp: 15 19 20 20   Temp:  97.6 F (36.4 C)  (!) 97.5 F (36.4 C)  TempSrc:  Oral  Oral  SpO2: 94% 91% 92% 96%  Weight:      Height:       General: Patient is alert awake oriented x 3 appears to be in no distress, family at bedside Respiratory exam: Bilateral intravesically Cardiovascular exam S1-S2 normal Abdomen all quadrant soft nontender bowel sounds normal Extremities warm without edema no focal deficit. Data Reviewed:  Labs on Admission:  Results for orders placed or performed during the hospital encounter of 12/07/23 (from the past 24 hours)  CBC with  Differential     Status: Abnormal   Collection Time: 12/07/23  8:48 AM  Result Value Ref Range   WBC 12.5 (H) 4.0 - 10.5 K/uL   RBC 4.45 4.22 - 5.81 MIL/uL   Hemoglobin 13.0 13.0 - 17.0 g/dL   HCT 16.1 (L) 09.6 - 04.5 %   MCV 84.0 80.0 - 100.0 fL   MCH 29.2 26.0 - 34.0 pg   MCHC 34.8 30.0 - 36.0 g/dL   RDW 40.9 81.1 - 91.4 %   Platelets 256 150 - 400 K/uL   nRBC 0.0 0.0 - 0.2 %   Neutrophils Relative % 75 %   Neutro Abs 9.4 (H) 1.7 - 7.7 K/uL   Lymphocytes Relative 16 %   Lymphs Abs 1.9 0.7 - 4.0 K/uL   Monocytes Relative 8 %   Monocytes Absolute 1.0 0.1 - 1.0 K/uL   Eosinophils Relative 0 %   Eosinophils Absolute 0.0 0.0 - 0.5 K/uL   Basophils Relative 0 %   Basophils Absolute 0.0 0.0 - 0.1 K/uL   Immature Granulocytes 1 %   Abs Immature Granulocytes 0.06 0.00 - 0.07 K/uL  Comprehensive metabolic panel     Status: Abnormal   Collection Time: 12/07/23  8:48 AM  Result Value Ref Range   Sodium 125 (L) 135 - 145 mmol/L   Potassium 3.3 (L) 3.5 - 5.1 mmol/L   Chloride 88 (L) 98 - 111 mmol/L   CO2 27 22 - 32 mmol/L   Glucose, Bld 138 (H) 70 - 99 mg/dL   BUN 25 (H) 8 - 23 mg/dL   Creatinine, Ser 7.82 0.61 - 1.24 mg/dL   Calcium 9.4 8.9 - 95.6 mg/dL   Total Protein 6.7 6.5 - 8.1 g/dL   Albumin 3.7 3.5 - 5.0 g/dL   AST 36 15 - 41 U/L   ALT 11 0 - 44 U/L   Alkaline Phosphatase 36 (L) 38 - 126 U/L   Total Bilirubin 0.7 0.0 - 1.2 mg/dL   GFR, Estimated >21 >30 mL/min   Anion gap 10 5 - 15  Lipase, blood     Status: None   Collection Time: 12/07/23  8:48 AM  Result Value Ref Range   Lipase 12 11 - 51 U/L  Troponin I (High Sensitivity)     Status: None   Collection Time: 12/07/23  8:48 AM  Result Value Ref Range   Troponin I (High Sensitivity) 17 <18 ng/L  Sodium     Status: Abnormal   Collection Time: 12/07/23  9:47 AM  Result Value Ref Range   Sodium 125 (L) 135 - 145 mmol/L  Troponin I (High Sensitivity)     Status: Abnormal   Collection Time: 12/07/23 10:30 AM  Result  Value Ref Range   Troponin I (High Sensitivity) 19 (H) <18 ng/L   Basic Metabolic Panel: Recent Labs  Lab 12/07/23 0848 12/07/23 0947  NA 125* 125*  K 3.3*  --   CL 88*  --   CO2 27  --   GLUCOSE 138*  --   BUN 25*  --   CREATININE 1.10  --   CALCIUM 9.4  --    Liver Function Tests: Recent Labs  Lab 12/07/23 0848  AST 36  ALT 11  ALKPHOS 36*  BILITOT 0.7  PROT 6.7  ALBUMIN 3.7   Recent Labs  Lab 12/07/23 0848  LIPASE 12   No results for input(s): "AMMONIA" in the last 168 hours. CBC: Recent Labs  Lab 12/07/23 0848  WBC 12.5*  NEUTROABS 9.4*  HGB 13.0  HCT 37.4*  MCV 84.0  PLT 256   Cardiac Enzymes: Recent Labs  Lab 12/07/23 0848 12/07/23 1030  TROPONINIHS 17 19*    BNP (last 3 results) No results for input(s): "PROBNP" in the last 8760 hours. CBG: No results for input(s): "GLUCAP" in the last 168 hours.  Radiological Exams on Admission:  CT Angio Chest/Abd/Pel for Dissection W and/or Wo Contrast Result Date: 12/07/2023 CLINICAL DATA:  Acute aortic syndrome suspected, diarrhea, fatigue, achiness, dark emesis EXAM: CT ANGIOGRAPHY CHEST, ABDOMEN AND PELVIS TECHNIQUE: Non-contrast CT of the chest was initially obtained. Multidetector CT imaging through the chest, abdomen and pelvis was performed using the standard protocol during bolus administration of intravenous contrast. Multiplanar reconstructed images and MIPs were obtained and reviewed to evaluate the vascular anatomy. RADIATION DOSE REDUCTION: This exam was performed according to the departmental dose-optimization program which includes automated exposure control, adjustment of the mA and/or kV according to patient size and/or use of iterative reconstruction technique. CONTRAST:  OMNIPAQUE IOHEXOL 350 MG/ML SOLN COMPARISON:  CT abdomen pelvis, 02/12/2017 FINDINGS: CTA CHEST FINDINGS VASCULAR Aorta: Satisfactory opacification of the aorta. Normal contour and caliber of the thoracic aorta. No  evidence of aneurysm, dissection, or other acute aortic pathology. Mild aortic atherosclerosis. Cardiovascular: No evidence of pulmonary embolism on limited non-tailored examination. Normal heart size. Mild cardiomegaly. No pericardial effusion. Review of the MIP images confirms the above findings. NON VASCULAR Mediastinum/Nodes: No enlarged mediastinal, hilar, or axillary lymph nodes. Small hiatal hernia. Thyroid gland, trachea, and esophagus demonstrate no significant findings. Lungs/Pleura: Mild bibasilar scarring. Multiple small definitively benign calcified pulmonary nodules, requiring no further follow-up or characterization. No pleural effusion or pneumothorax. Musculoskeletal: No chest wall abnormality. No acute osseous findings. Review of the MIP images confirms the above findings. CTA ABDOMEN AND PELVIS FINDINGS VASCULAR Normal contour and caliber of the abdominal aorta. Mild mixed calcific atherosclerosis. No evidence of aneurysm, dissection, or other acute aortic pathology. Standard branching pattern of the abdominal aorta with solitary bilateral renal arteries. Review of the MIP images confirms the above findings. NON-VASCULAR Hepatobiliary: No solid liver abnormality is seen. Hepatic steatosis. Small gallstones. No gallbladder wall thickening, or biliary dilatation. Pancreas: Unremarkable. No pancreatic ductal dilatation or surrounding inflammatory changes. Spleen: Normal in size without significant abnormality. Adrenals/Urinary Tract: Adrenal glands are unremarkable. Kidneys are normal, without renal calculi, solid lesion, or hydronephrosis. Bladder is unremarkable. Stomach/Bowel: Stomach is within normal limits. Diverticulum of the transverse duodenum. Appendix not clearly visualized. Fluid-filled, although nondistended loops of small bowel throughout the mid abdomen (series 4, image 174). No  evidence of bowel wall thickening, distention, or inflammatory changes. Severe pancolonic diverticulosis.  Lymphatic: No enlarged abdominal or pelvic lymph nodes. Reproductive: Prostatomegaly. Other: No abdominal wall hernia or abnormality. No ascites. Musculoskeletal: No acute osseous findings. 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 Normal contour and caliber of the thoracic and abdominal aorta. No evidence of aneurysm, dissection, or other acute aortic pathology. Mild aortic atherosclerosis. 2. Fluid-filled, nondistended loops of small bowel throughout the mid abdomen, suggesting nonspecific enteritis. No evidence of bowel obstruction. Gas and stool present to the rectum. 3. Severe pancolonic diverticulosis without evidence of acute diverticulitis. 4. Hepatic steatosis. 5. Cholelithiasis without evidence of acute cholecystitis. Aortic Atherosclerosis (ICD10-I70.0). Electronically Signed   By: Jearld Lesch M.D.   On: 12/07/2023 10:40     No intake/output data recorded. No intake/output data recorded.       Assessment and Plan: * GI bleeding Patient seems to be having upper GI bleed since last evening.  With major episode last evening reported as very dark emesis.  Since then only minimal emesis black.  Check type and screen, pantoprazole.  No suspicion of portal htn.  I will hold off on patient hypertensive agents except for Coreg.  Restart as able in the morning.  I have sent a message to [facility] to kindly evaluate the patient in the morning.  He has previously had hematochezia and seen [facility] for same. Check stool for h pylori  Enteritis based on history of 4 loose bowel movements a day before yesterday, since then patient reports no further diarrhea.  Clinically speaking I think it has resolved, patient already received ceftriaxone and metronidazole in the ER, will continue same for the next few days.  Hyponatremia Dentally noted, check serum osmolality.  Check urine osmolality.  And sodium.  My concern is that this is likely  prerenal. Moderate chronic asymptomatic.   Hiccups - reglan and zofran as needed.  BPH - proscar.     Advance Care Planning:   Code Status: Full Code   Consults: GI eval requested for am as above. Please folow up.  Family Communication: wife and son in law at bedside. All questions answered.  Severity of Illness: The appropriate patient status for this patient is INPATIENT. Inpatient status is judged to be reasonable and necessary in order to provide the required intensity of service to ensure the patient's safety. The patient's presenting symptoms, physical exam findings, and initial radiographic and laboratory data in the context of their chronic comorbidities is felt to place them at high risk for further clinical deterioration. Furthermore, it is not anticipated that the patient will be medically stable for discharge from the hospital within 2 midnights of admission.   * I certify that at the point of admission it is my clinical judgment that the patient will require inpatient hospital care spanning beyond 2 midnights from the point of admission due to high intensity of service, high risk for further deterioration and high frequency of surveillance required.*  Author: Nolberto Hanlon, MD 12/07/2023 6:45 PM  For on call review [email or web address].

## 2023-12-07 NOTE — Assessment & Plan Note (Signed)
 Dentally noted, check serum osmolality.  Check urine osmolality.  And sodium.  My concern is that this is likely prerenal. Moderate chronic asymptomatic.

## 2023-12-08 DIAGNOSIS — K529 Noninfective gastroenteritis and colitis, unspecified: Secondary | ICD-10-CM | POA: Diagnosis not present

## 2023-12-08 DIAGNOSIS — K219 Gastro-esophageal reflux disease without esophagitis: Secondary | ICD-10-CM | POA: Diagnosis not present

## 2023-12-08 DIAGNOSIS — R112 Nausea with vomiting, unspecified: Secondary | ICD-10-CM | POA: Diagnosis not present

## 2023-12-08 DIAGNOSIS — E871 Hypo-osmolality and hyponatremia: Secondary | ICD-10-CM | POA: Diagnosis not present

## 2023-12-08 DIAGNOSIS — K920 Hematemesis: Secondary | ICD-10-CM | POA: Diagnosis not present

## 2023-12-08 LAB — BASIC METABOLIC PANEL WITH GFR
Anion gap: 8 (ref 5–15)
Anion gap: 9 (ref 5–15)
BUN: 30 mg/dL — ABNORMAL HIGH (ref 8–23)
BUN: 29 mg/dL — ABNORMAL HIGH (ref 8–23)
CO2: 25 mmol/L (ref 22–32)
CO2: 26 mmol/L (ref 22–32)
Calcium: 8.9 mg/dL (ref 8.9–10.3)
Calcium: 8.6 mg/dL — ABNORMAL LOW (ref 8.9–10.3)
Chloride: 92 mmol/L — ABNORMAL LOW (ref 98–111)
Chloride: 93 mmol/L — ABNORMAL LOW (ref 98–111)
Creatinine, Ser: 1.13 mg/dL (ref 0.61–1.24)
Creatinine, Ser: 0.98 mg/dL (ref 0.61–1.24)
GFR, Estimated: >60 mL/min (ref >60)
GFR, Estimated: >60 mL/min (ref >60)
Glucose, Bld: 140 mg/dL — ABNORMAL HIGH (ref 70–99)
Glucose, Bld: 113 mg/dL — ABNORMAL HIGH (ref 70–99)
Potassium: 3.8 mmol/L (ref 3.5–5.1)
Potassium: 3.8 mmol/L (ref 3.5–5.1)
Sodium: 125 mmol/L — ABNORMAL LOW (ref 135–145)
Sodium: 128 mmol/L — ABNORMAL LOW (ref 135–145)

## 2023-12-08 LAB — CBC
HCT: 39 % (ref 39.0–52.0)
Hemoglobin: 13 g/dL (ref 13.0–17.0)
MCH: 29.5 pg (ref 26.0–34.0)
MCHC: 33.3 g/dL (ref 30.0–36.0)
MCV: 88.6 fL (ref 80.0–100.0)
Platelets: 258 10*3/uL (ref 150–400)
RBC: 4.4 MIL/uL (ref 4.22–5.81)
RDW: 14.1 % (ref 11.5–15.5)
WBC: 13.1 10*3/uL — ABNORMAL HIGH (ref 4.0–10.5)
nRBC: 0 % (ref 0.0–0.2)

## 2023-12-08 LAB — OSMOLALITY, URINE: Osmolality, Ur: 827 mosm/kg (ref 300–900)

## 2023-12-08 LAB — APTT: aPTT: 27 s (ref 24–36)

## 2023-12-08 LAB — OSMOLALITY: Osmolality: 278 mosm/kg (ref 275–295)

## 2023-12-08 MED ORDER — LABETALOL HCL 5 MG/ML IV SOLN
5.0000 mg | Freq: Once | INTRAVENOUS | Status: AC
  Administered 2023-12-08: 5 mg via INTRAVENOUS
  Filled 2023-12-08: qty 4

## 2023-12-08 MED ORDER — HYDRALAZINE HCL 20 MG/ML IJ SOLN
10.0000 mg | Freq: Four times a day (QID) | INTRAMUSCULAR | Status: DC | PRN
  Administered 2023-12-09 – 2023-12-10 (×2): 10 mg via INTRAVENOUS
  Filled 2023-12-08 (×2): qty 1

## 2023-12-08 MED ORDER — DOXAZOSIN MESYLATE 4 MG PO TABS
8.0000 mg | ORAL_TABLET | Freq: Every day | ORAL | Status: DC
  Administered 2023-12-08 – 2023-12-10 (×3): 8 mg via ORAL
  Filled 2023-12-08 (×3): qty 2

## 2023-12-08 MED ORDER — CARVEDILOL 25 MG PO TABS
25.0000 mg | ORAL_TABLET | Freq: Two times a day (BID) | ORAL | Status: DC
  Administered 2023-12-08 – 2023-12-10 (×5): 25 mg via ORAL
  Filled 2023-12-08 (×5): qty 1

## 2023-12-08 MED ORDER — SODIUM CHLORIDE 0.9 % IV SOLN: INTRAVENOUS | Status: AC

## 2023-12-08 NOTE — Progress Notes (Signed)
 MD updated about elevated BP 190/100, pt  c/o head pressure.

## 2023-12-08 NOTE — Plan of Care (Signed)
  Problem: Education: Goal: Knowledge of General Education information will improve Description: Including pain rating scale, medication(s)/side effects and non-pharmacologic comfort measures 12/08/2023 0406 by Charmian Muff, RN Outcome: Progressing 12/08/2023 0403 by Charmian Muff, RN Outcome: Progressing   Problem: Health Behavior/Discharge Planning: Goal: Ability to manage health-related needs will improve Outcome: Progressing   Problem: Clinical Measurements: Goal: Ability to maintain clinical measurements within normal limits will improve 12/08/2023 0406 by Charmian Muff, RN Outcome: Progressing 12/08/2023 0403 by Charmian Muff, RN Outcome: Progressing Goal: Will remain free from infection 12/08/2023 0406 by Charmian Muff, RN Outcome: Progressing 12/08/2023 0403 by Charmian Muff, RN Outcome: Progressing Goal: Respiratory complications will improve Outcome: Progressing

## 2023-12-08 NOTE — Consult Note (Addendum)
 Referring Provider: Dr. Maryjean Ka, [facility] Primary Care Physician:  Lise Auer, MD Primary Gastroenterologist:  Dr. Marina Goodell  Reason for Consultation:  UGIB  HPI: Donald Ball is a 88 y.o. male with limited past medical history as below.  He says that he was in his usual state of health until Monday evening.  He took a Tums because he felt like his stomach was upset.  He takes Prilosec twice daily, but even despite that he sometimes will need to take Tums.  Then Monday night he ended up having diarrhea several times.  He did not really look at the color as it happened at night so he did not really pay attention to that.  Prior to that bowel movements were normal.  They said that they were at a funeral type service with food, etc. prior to the onset of symptoms.  Then on Tuesday he started vomiting.  Right off the bat his emesis was dark black in color.  This happened 4 times and then he presented to [facility] ED.  Had more episodes while there.  Also say that he's had a lot of hiccups since this began.  Has had no further diarrhea since Monday night.  Had a small episode of dark-colored emesis overnight and was given Reglan for nausea.  This morning he would like to try to sip on some clear liquids.  Some upper abdominal pain and distention/tightness sensation.  He has not any blood thinners and denies NSAID use.  They tell me that he had an endoscopy in about 2018 while at [facility] by Dr. Charm Barges that they say was unremarkable at that time.  Prior to the onset of this he says that he felt well with normal bowel movements, etc.  Hemoglobin has remained normal at 13.0 g.  He does have a slight leukocytosis at 13.1 K.  BUN is up around 30 with a normal creatinine.  Na+ is low at 125 and the confirm that it is always somewhat low.  CT angio of the chest abdomen pelvis:  IMPRESSION: 1. Normal contour and caliber of the thoracic and abdominal aorta. No evidence of aneurysm, dissection, or other  acute aortic pathology. Mild aortic atherosclerosis. 2. Fluid-filled, nondistended loops of small bowel throughout the mid abdomen, suggesting nonspecific enteritis. No evidence of bowel obstruction. Gas and stool present to the rectum. 3. Severe pancolonic diverticulosis without evidence of acute diverticulitis. 4. Hepatic steatosis. 5. Cholelithiasis without evidence of acute cholecystitis.   Aortic Atherosclerosis (ICD10-I70.0).  Colonoscopy was in 2007 with only diverticulosis.    Past Medical History:  Diagnosis Date   Anemia associated with acute blood loss    Benign essential hypertension    BPH (benign prostatic hyperplasia)    Diverticulosis    GERD (gastroesophageal reflux disease)    GI bleed    Hyperlipidemia    Melanoma in situ of left ear [facility])     Past Surgical History:  Procedure Laterality Date   CATARACT EXTRACTION      Prior to Admission medications   Medication Sig Start Date End Date Taking? Authorizing Provider  carvedilol (COREG) 25 MG tablet Take 25 mg by mouth 2 (two) times daily with a meal.   Yes [provider]  doxazosin (CARDURA) 4 MG tablet Take 4 mg by mouth daily. *mid morning*   Yes [provider]  doxazosin (CARDURA) 8 MG tablet Take 8 mg by mouth daily.   Yes [provider]  finasteride (PROSCAR) 5 MG tablet  Take 5 mg by mouth daily.   Yes [provider]  latanoprost (XALATAN) 0.005 % ophthalmic solution Place 1 drop into both eyes at bedtime. 09/13/23  Yes [provider]  Multiple Vitamin (MULTIVITAMIN) tablet Take 1 tablet by mouth daily.   Yes [provider]  olmesartan-hydrochlorothiazide (BENICAR HCT) 40-12.5 MG tablet Take 1 tablet by mouth daily.   Yes [provider]  omeprazole (PRILOSEC) 20 MG capsule Take 20 mg by mouth 2 (two) times daily before a meal.    Yes [provider]  vitamin C (ASCORBIC ACID) 500 MG tablet Take 500 mg by mouth daily.   Yes  [provider]    Current Facility-Administered Medications  Medication Dose Route Frequency Provider Last Rate Last Admin   acetaminophen (TYLENOL) tablet 650 mg  650 mg Oral Q6H PRN Nolberto Hanlon, MD       Or   acetaminophen (TYLENOL) suppository 650 mg  650 mg Rectal Q6H PRN Nolberto Hanlon, MD       carvedilol (COREG) tablet 25 mg  25 mg Oral BID WC Kyere, Belinda K, NP   25 mg at 12/08/23 0451   cefTRIAXone (ROCEPHIN) 2 g in sodium chloride 0.9 % 100 mL IVPB  2 g Intravenous Q24H Nolberto Hanlon, MD       doxazosin (CARDURA) tablet 8 mg  8 mg Oral Daily Rizwan, Ladell Heads, MD       finasteride (PROSCAR) tablet 5 mg  5 mg Oral Daily Nolberto Hanlon, MD       lactated ringers infusion   Intravenous Continuous Nolberto Hanlon, MD 125 mL/hr at 12/07/23 2329 New Bag at 12/07/23 2329   latanoprost (XALATAN) 0.005 % ophthalmic solution 1 drop  1 drop Both Eyes QHS Nolberto Hanlon, MD   1 drop at 12/07/23 2326   metoCLOPramide (REGLAN) injection 5 mg  5 mg Intravenous Q8H PRN Nolberto Hanlon, MD   5 mg at 12/08/23 4696   metroNIDAZOLE (FLAGYL) IVPB 500 mg  500 mg Intravenous Conchita Paris, MD 100 mL/hr at 12/08/23 0006 500 mg at 12/08/23 0006   multivitamin with minerals tablet 1 tablet  1 tablet Oral Daily Nolberto Hanlon, MD       ondansetron [facility]) injection 4 mg  4 mg Intravenous Once Nolberto Hanlon, MD       pantoprazole (PROTONIX) injection 40 mg  40 mg Intravenous Conchita Paris, MD   40 mg at 12/07/23 2137   polyethylene glycol (MIRALAX / GLYCOLAX) packet 17 g  17 g Oral Daily PRN Nolberto Hanlon, MD       sodium chloride flush (NS) 0.9 % injection 3 mL  3 mL Intravenous Q12H Nolberto Hanlon, MD   3 mL at 12/07/23 2137    Allergies as of 12/07/2023 - Review Complete 12/07/2023  Allergen Reaction Noted   Altace [ramipril]  10/27/2016   Hydralazine  10/27/2016   Nisoldipine  07/29/2009   Norvasc [amlodipine besylate]  10/27/2016   Penicillins  07/29/2009   Sular [nisoldipine er]  10/27/2016    Family History   Problem Relation Age of Onset   Lung cancer Father    Other Mother        died from old age   Diabetes Sister    Diabetes Brother    Prostate cancer Brother    Colon cancer Neg Hx    Stomach cancer Neg Hx    Rectal cancer Neg Hx    Esophageal cancer Neg Hx    Liver cancer Neg  Hx     Social History   Socioeconomic History   Marital status: Married    Spouse name: Not on file   Number of children: 2   Years of education: Not on file   Highest education level: Not on file  Occupational History   Occupation: retired  Tobacco Use   Smoking status: Never   Smokeless tobacco: Never  Substance and Sexual Activity   Alcohol use: No   Drug use: No   Sexual activity: Not on file  Other Topics Concern   Not on file  Social History Narrative   Not on file   Social Drivers of Health   Financial Resource Strain: Not on file  Food Insecurity: No Food Insecurity (12/07/2023)   Hunger Vital Sign    Worried About Running Out of Food in the Last Year: Never true    Ran Out of Food in the Last Year: Never true  Transportation Needs: No Transportation Needs (12/07/2023)   PRAPARE - Administrator, Civil Service (Medical): No    Lack of Transportation (Non-Medical): No  Physical Activity: Not on file  Stress: Not on file  Social Connections: Moderately Integrated (12/07/2023)   Social Connection and Isolation Panel [NHANES]    Frequency of Communication with Friends and Family: More than three times a week    Frequency of Social Gatherings with Friends and Family: More than three times a week    Attends Religious Services: More than 4 times per year    Active Member of [employer] or Organizations: No    Attends Banker Meetings: Never    Marital Status: Married  Catering manager Violence: Not At Risk (12/07/2023)   Humiliation, Afraid, Rape, and Kick questionnaire    Fear of Current or Ex-Partner: No    Emotionally Abused: No    Physically Abused: No    Sexually  Abused: No    Review of Systems: ROS is O/W negative except as mentioned in HPI.  Physical Exam: Vital signs in last 24 hours: Temp:  [97.5 F (36.4 C)-97.9 F (36.6 C)] 97.9 F (36.6 C) (04/10 0628) Pulse Rate:  [71-88] 76 (04/10 0628) Resp:  [15-29] 18 (04/10 0628) BP: (132-197)/(86-112) 178/106 (04/10 0748) SpO2:  [91 %-99 %] 96 % (04/10 0628) Last BM Date : 12/05/23 General:  Alert, Well-developed, well-nourished, pleasant and cooperative in NAD Head:  Normocephalic and atraumatic. Eyes:  Sclera clear, no icterus.  Conjunctiva pink. Ears:  Normal auditory acuity. Mouth:  No deformity or lesions.   Lungs:  Clear throughout to auscultation.  No wheezes, crackles, or rhonchi.  Heart:  Regular rate and rhythm; no murmurs, clicks, rubs, or gallops. Abdomen:  Soft, somewhat distended.  BS present.  Mild epigastric TTP. Msk:  Symmetrical without gross deformities. Pulses:  Normal pulses noted. Extremities:  Without clubbing or edema. Neurologic:  Alert and oriented x 4;  grossly normal neurologically. Skin:  Intact without significant lesions or rashes. Psych:  Alert and cooperative. Normal mood and affect.  Intake/Output from previous day: 04/09 0701 - 04/10 0700 In: 600 [I.V.:500; IV Piggyback:100] Out: -   Lab Results: Recent Labs    12/07/23 0848 12/07/23 1902 12/08/23 0334  WBC 12.5* 13.0* 13.1*  HGB 13.0 13.1 13.0  HCT 37.4* 39.2 39.0  PLT 256 245 258   BMET Recent Labs    12/07/23 0848 12/07/23 0947 12/07/23 1902 12/08/23 0334  NA 125* 125* 123* 125*  K 3.3*  --  3.1* 3.8  CL 88*  --  88* 92*  CO2 27  --  27 25  GLUCOSE 138*  --  146* 140*  BUN 25*  --  30* 30*  CREATININE 1.10  --  1.07 1.13  CALCIUM 9.4  --  8.9 8.9   LFT Recent Labs    12/07/23 0848  PROT 6.7  ALBUMIN 3.7  AST 36  ALT 11  ALKPHOS 36*  BILITOT 0.7   PT/INR Recent Labs    12/07/23 2034  LABPROT 14.4  INR 1.1    Studies/Results: CT Angio Chest/Abd/Pel for  Dissection W and/or Wo Contrast Result Date: 12/07/2023 CLINICAL DATA:  Acute aortic syndrome suspected, diarrhea, fatigue, achiness, dark emesis EXAM: CT ANGIOGRAPHY CHEST, ABDOMEN AND PELVIS TECHNIQUE: Non-contrast CT of the chest was initially obtained. Multidetector CT imaging through the chest, abdomen and pelvis was performed using the standard protocol during bolus administration of intravenous contrast. Multiplanar reconstructed images and MIPs were obtained and reviewed to evaluate the vascular anatomy. RADIATION DOSE REDUCTION: This exam was performed according to the departmental dose-optimization program which includes automated exposure control, adjustment of the mA and/or kV according to patient size and/or use of iterative reconstruction technique. CONTRAST:  OMNIPAQUE IOHEXOL 350 MG/ML SOLN COMPARISON:  CT abdomen pelvis, 02/12/2017 FINDINGS: CTA CHEST FINDINGS VASCULAR Aorta: Satisfactory opacification of the aorta. Normal contour and caliber of the thoracic aorta. No evidence of aneurysm, dissection, or other acute aortic pathology. Mild aortic atherosclerosis. Cardiovascular: No evidence of pulmonary embolism on limited non-tailored examination. Normal heart size. Mild cardiomegaly. No pericardial effusion. Review of the MIP images confirms the above findings. NON VASCULAR Mediastinum/Nodes: No enlarged mediastinal, hilar, or axillary lymph nodes. Small hiatal hernia. Thyroid gland, trachea, and esophagus demonstrate no significant findings. Lungs/Pleura: Mild bibasilar scarring. Multiple small definitively benign calcified pulmonary nodules, requiring no further follow-up or characterization. No pleural effusion or pneumothorax. Musculoskeletal: No chest wall abnormality. No acute osseous findings. Review of the MIP images confirms the above findings. CTA ABDOMEN AND PELVIS FINDINGS VASCULAR Normal contour and caliber of the abdominal aorta. Mild mixed calcific atherosclerosis. No evidence  of aneurysm, dissection, or other acute aortic pathology. Standard branching pattern of the abdominal aorta with solitary bilateral renal arteries. Review of the MIP images confirms the above findings. NON-VASCULAR Hepatobiliary: No solid liver abnormality is seen. Hepatic steatosis. Small gallstones. No gallbladder wall thickening, or biliary dilatation. Pancreas: Unremarkable. No pancreatic ductal dilatation or surrounding inflammatory changes. Spleen: Normal in size without significant abnormality. Adrenals/Urinary Tract: Adrenal glands are unremarkable. Kidneys are normal, without renal calculi, solid lesion, or hydronephrosis. Bladder is unremarkable. Stomach/Bowel: Stomach is within normal limits. Diverticulum of the transverse duodenum. Appendix not clearly visualized. Fluid-filled, although nondistended loops of small bowel throughout the mid abdomen (series 4, image 174). No evidence of bowel wall thickening, distention, or inflammatory changes. Severe pancolonic diverticulosis. Lymphatic: No enlarged abdominal or pelvic lymph nodes. Reproductive: Prostatomegaly. Other: No abdominal wall hernia or abnormality. No ascites. Musculoskeletal: No acute osseous findings. 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 Normal contour and caliber of the thoracic and abdominal aorta. No evidence of aneurysm, dissection, or other acute aortic pathology. Mild aortic atherosclerosis. 2. Fluid-filled, nondistended loops of small bowel throughout the mid abdomen, suggesting nonspecific enteritis. No evidence of bowel obstruction. Gas and stool present to the rectum. 3. Severe pancolonic diverticulosis without evidence of acute diverticulitis. 4. Hepatic steatosis. 5. Cholelithiasis without evidence of acute cholecystitis. Aortic Atherosclerosis (ICD10-I70.0). Electronically  Signed   By: Jearld Lesch M.D.   On: 12/07/2023 10:40   IMPRESSION:  *Concern for upper GI  bleed with very dark emesis at home, starting Tuesday.  Hgb is normal.  BUN is up. *Enteritis: Symptoms initially started suddenly on Monday with diarrhea and now has findings of enteritis on CT scan. *GERD:  Does take BID PPI at home and occasional Tums as well.  Says the he had an EGD while at [facility] by Dr. Charm Barges in 2018 that was unremarkable at the time. *Hyponatremia:  Likely due to GI losses.  Looks like may be slightly low chronically as well and they say yes, it is chronically low.  Correction per primary service.  PLAN: - Continue pantoprazole 40 mg IV twice daily. - He is on Rocephin and Flagyl for enteritis. - Antiemetics as needed. - EGD at some point, timing TBD.  Seems that it will be 4/11 AM.  Can sip on clear liquids today then NPO after midnight. - Monitor labs.   Princella Pellegrini. Zehr  12/08/2023, 8:49 AM  I have taken an interval history, thoroughly reviewed the chart and examined the patient. I agree with the Advanced Practitioner's note, impression and recommendations, and have recorded additional findings, impressions and recommendations below. I performed a substantive portion of this encounter (>50% time spent), including a complete performance of the medical decision making.  My additional thoughts are as follows:  Acute onset symptoms and overall clinical picture favors infectious illness such as food poisoning or viral gastroenteritis.  I think that also accounts for the CT appearance of the small bowel.  I do not think there is bacterial infection and his antibiotic can be stopped.  Difficult to know for sure if this reported black vomitus was blood or foodstuffs, as he also reported having had some kind of chocolate dairy dessert that may not agreed with him. Underlying reflux symptoms requiring chronic acid suppression.  Upper endoscopy makes sense to be sure there is no serious upper GI pathology that may have caused bleeding.  I am reassured by his  persistently normal hemoglobin.  Remainder of symptoms expected to improve with supportive care and time.  He was agreeable to an upper endoscopy that we tentatively have scheduled tomorrow with Dr. Leone Payor pending availability of endoscopist and endoscopy department.  The benefits and risks of the planned procedure(s) were described in detail with the patient or (when appropriate) their health care proxy.  Risks were outlined as including, but not limited to, bleeding, infection, perforation, adverse medication reaction leading to cardiac or pulmonary decompensation, pancreatitis (if ERCP).  The limitation of incomplete mucosal visualization was also discussed.  No guarantees or warranties were given.  Wife present for entire visit.   Charlie Pitter III Office:[phone]

## 2023-12-08 NOTE — H&P (View-Only) (Signed)
 Referring Provider: Dr. Maryjean Ka, [facility] Primary Care Physician:  Lise Auer, MD Primary Gastroenterologist:  Dr. Marina Goodell  Reason for Consultation:  UGIB  HPI: Donald Ball is a 88 y.o. male with limited past medical history as below.  He says that he was in his usual state of health until Monday evening.  He took a Tums because he felt like his stomach was upset.  He takes Prilosec twice daily, but even despite that he sometimes will need to take Tums.  Then Monday night he ended up having diarrhea several times.  He did not really look at the color as it happened at night so he did not really pay attention to that.  Prior to that bowel movements were normal.  They said that they were at a funeral type service with food, etc. prior to the onset of symptoms.  Then on Tuesday he started vomiting.  Right off the bat his emesis was dark black in color.  This happened 4 times and then he presented to [facility] ED.  Had more episodes while there.  Also say that he's had a lot of hiccups since this began.  Has had no further diarrhea since Monday night.  Had a small episode of dark-colored emesis overnight and was given Reglan for nausea.  This morning he would like to try to sip on some clear liquids.  Some upper abdominal pain and distention/tightness sensation.  He has not any blood thinners and denies NSAID use.  They tell me that he had an endoscopy in about 2018 while at [facility] by Dr. Charm Barges that they say was unremarkable at that time.  Prior to the onset of this he says that he felt well with normal bowel movements, etc.  Hemoglobin has remained normal at 13.0 g.  He does have a slight leukocytosis at 13.1 Ball.  BUN is up around 30 with a normal creatinine.  Na+ is low at 125 and the confirm that it is always somewhat low.  CT angio of the chest abdomen pelvis:  IMPRESSION: 1. Normal contour and caliber of the thoracic and abdominal aorta. No evidence of aneurysm, dissection, or other  acute aortic pathology. Mild aortic atherosclerosis. 2. Fluid-filled, nondistended loops of small bowel throughout the mid abdomen, suggesting nonspecific enteritis. No evidence of bowel obstruction. Gas and stool present to the rectum. 3. Severe pancolonic diverticulosis without evidence of acute diverticulitis. 4. Hepatic steatosis. 5. Cholelithiasis without evidence of acute cholecystitis.   Aortic Atherosclerosis (ICD10-I70.0).  Colonoscopy was in 2007 with only diverticulosis.    Past Medical History:  Diagnosis Date   Anemia associated with acute blood loss    Benign essential hypertension    BPH (benign prostatic hyperplasia)    Diverticulosis    GERD (gastroesophageal reflux disease)    GI bleed    Hyperlipidemia    Melanoma in situ of left ear [facility])     Past Surgical History:  Procedure Laterality Date   CATARACT EXTRACTION      Prior to Admission medications   Medication Sig Start Date End Date Taking? Authorizing Provider  carvedilol (COREG) 25 MG tablet Take 25 mg by mouth 2 (two) times daily with a meal.   Yes [provider]  doxazosin (CARDURA) 4 MG tablet Take 4 mg by mouth daily. *mid morning*   Yes [provider]  doxazosin (CARDURA) 8 MG tablet Take 8 mg by mouth daily.   Yes [provider]  finasteride (PROSCAR) 5 MG tablet  Take 5 mg by mouth daily.   Yes [provider]  latanoprost (XALATAN) 0.005 % ophthalmic solution Place 1 drop into both eyes at bedtime. 09/13/23  Yes [provider]  Multiple Vitamin (MULTIVITAMIN) tablet Take 1 tablet by mouth daily.   Yes [provider]  olmesartan-hydrochlorothiazide (BENICAR HCT) 40-12.5 MG tablet Take 1 tablet by mouth daily.   Yes [provider]  omeprazole (PRILOSEC) 20 MG capsule Take 20 mg by mouth 2 (two) times daily before a meal.    Yes [provider]  vitamin C (ASCORBIC ACID) 500 MG tablet Take 500 mg by mouth daily.   Yes  [provider]    Current Facility-Administered Medications  Medication Dose Route Frequency Provider Last Rate Last Admin   acetaminophen (TYLENOL) tablet 650 mg  650 mg Oral Q6H PRN Donald Hanlon, MD       Or   acetaminophen (TYLENOL) suppository 650 mg  650 mg Rectal Q6H PRN Donald Hanlon, MD       carvedilol (COREG) tablet 25 mg  25 mg Oral BID WC Donald Ball, Donald K, NP   25 mg at 12/08/23 0451   cefTRIAXone (ROCEPHIN) 2 g in sodium chloride 0.9 % 100 mL IVPB  2 g Intravenous Q24H Donald Hanlon, MD       doxazosin (CARDURA) tablet 8 mg  8 mg Oral Daily Donald Ball, Donald Heads, MD       finasteride (PROSCAR) tablet 5 mg  5 mg Oral Daily Donald Hanlon, MD       lactated ringers infusion   Intravenous Continuous Donald Hanlon, MD 125 mL/hr at 12/07/23 2329 New Bag at 12/07/23 2329   latanoprost (XALATAN) 0.005 % ophthalmic solution 1 drop  1 drop Both Eyes QHS Donald Hanlon, MD   1 drop at 12/07/23 2326   metoCLOPramide (REGLAN) injection 5 mg  5 mg Intravenous Q8H PRN Donald Hanlon, MD   5 mg at 12/08/23 4696   metroNIDAZOLE (FLAGYL) IVPB 500 mg  500 mg Intravenous Donald Paris, MD 100 mL/hr at 12/08/23 0006 500 mg at 12/08/23 0006   multivitamin with minerals tablet 1 tablet  1 tablet Oral Daily Donald Hanlon, MD       ondansetron [facility]) injection 4 mg  4 mg Intravenous Once Donald Hanlon, MD       pantoprazole (PROTONIX) injection 40 mg  40 mg Intravenous Donald Paris, MD   40 mg at 12/07/23 2137   polyethylene glycol (MIRALAX / GLYCOLAX) packet 17 g  17 g Oral Daily PRN Donald Hanlon, MD       sodium chloride flush (NS) 0.9 % injection 3 mL  3 mL Intravenous Q12H Donald Hanlon, MD   3 mL at 12/07/23 2137    Allergies as of 12/07/2023 - Review Complete 12/07/2023  Allergen Reaction Noted   Altace [ramipril]  10/27/2016   Hydralazine  10/27/2016   Nisoldipine  07/29/2009   Norvasc [amlodipine besylate]  10/27/2016   Penicillins  07/29/2009   Sular [nisoldipine er]  10/27/2016    Family History   Problem Relation Age of Onset   Lung cancer Father    Other Mother        died from old age   Diabetes Sister    Diabetes Brother    Prostate cancer Brother    Colon cancer Neg Hx    Stomach cancer Neg Hx    Rectal cancer Neg Hx    Esophageal cancer Neg Hx    Liver cancer Neg  Hx     Social History   Socioeconomic History   Marital status: Married    Spouse name: Not on file   Number of children: 2   Years of education: Not on file   Highest education level: Not on file  Occupational History   Occupation: retired  Tobacco Use   Smoking status: Never   Smokeless tobacco: Never  Substance and Sexual Activity   Alcohol use: No   Drug use: No   Sexual activity: Not on file  Other Topics Concern   Not on file  Social History Narrative   Not on file   Social Drivers of Health   Financial Resource Strain: Not on file  Food Insecurity: No Food Insecurity (12/07/2023)   Hunger Vital Sign    Worried About Running Out of Food in the Last Year: Never true    Ran Out of Food in the Last Year: Never true  Transportation Needs: No Transportation Needs (12/07/2023)   PRAPARE - Administrator, Civil Service (Medical): No    Lack of Transportation (Non-Medical): No  Physical Activity: Not on file  Stress: Not on file  Social Connections: Moderately Integrated (12/07/2023)   Social Connection and Isolation Panel [NHANES]    Frequency of Communication with Friends and Family: More than three times a week    Frequency of Social Gatherings with Friends and Family: More than three times a week    Attends Religious Services: More than 4 times per year    Active Member of [employer] or Organizations: No    Attends Banker Meetings: Never    Marital Status: Married  Catering manager Violence: Not At Risk (12/07/2023)   Humiliation, Afraid, Rape, and Kick questionnaire    Fear of Current or Ex-Partner: No    Emotionally Abused: No    Physically Abused: No    Sexually  Abused: No    Review of Systems: ROS is O/W negative except as mentioned in HPI.  Physical Exam: Vital signs in last 24 hours: Temp:  [97.5 F (36.4 C)-97.9 F (36.6 C)] 97.9 F (36.6 C) (04/10 0628) Pulse Rate:  [71-88] 76 (04/10 0628) Resp:  [15-29] 18 (04/10 0628) BP: (132-197)/(86-112) 178/106 (04/10 0748) SpO2:  [91 %-99 %] 96 % (04/10 0628) Last BM Date : 12/05/23 General:  Alert, Well-developed, well-nourished, pleasant and cooperative in NAD Head:  Normocephalic and atraumatic. Eyes:  Sclera clear, no icterus.  Conjunctiva pink. Ears:  Normal auditory acuity. Mouth:  No deformity or lesions.   Lungs:  Clear throughout to auscultation.  No wheezes, crackles, or rhonchi.  Heart:  Regular rate and rhythm; no murmurs, clicks, rubs, or gallops. Abdomen:  Soft, somewhat distended.  BS present.  Mild epigastric TTP. Msk:  Symmetrical without gross deformities. Pulses:  Normal pulses noted. Extremities:  Without clubbing or edema. Neurologic:  Alert and oriented x 4;  grossly normal neurologically. Skin:  Intact without significant lesions or rashes. Psych:  Alert and cooperative. Normal mood and affect.  Intake/Output from previous day: 04/09 0701 - 04/10 0700 In: 600 [I.V.:500; IV Piggyback:100] Out: -   Lab Results: Recent Labs    12/07/23 0848 12/07/23 1902 12/08/23 0334  WBC 12.5* 13.0* 13.1*  HGB 13.0 13.1 13.0  HCT 37.4* 39.2 39.0  PLT 256 245 258   BMET Recent Labs    12/07/23 0848 12/07/23 0947 12/07/23 1902 12/08/23 0334  NA 125* 125* 123* 125*  Ball 3.3*  --  3.1* 3.8  CL 88*  --  88* 92*  CO2 27  --  27 25  GLUCOSE 138*  --  146* 140*  BUN 25*  --  30* 30*  CREATININE 1.10  --  1.07 1.13  CALCIUM 9.4  --  8.9 8.9   LFT Recent Labs    12/07/23 0848  PROT 6.7  ALBUMIN 3.7  AST 36  ALT 11  ALKPHOS 36*  BILITOT 0.7   PT/INR Recent Labs    12/07/23 2034  LABPROT 14.4  INR 1.1    Studies/Results: CT Angio Chest/Abd/Pel for  Dissection W and/or Wo Contrast Result Date: 12/07/2023 CLINICAL DATA:  Acute aortic syndrome suspected, diarrhea, fatigue, achiness, dark emesis EXAM: CT ANGIOGRAPHY CHEST, ABDOMEN AND PELVIS TECHNIQUE: Non-contrast CT of the chest was initially obtained. Multidetector CT imaging through the chest, abdomen and pelvis was performed using the standard protocol during bolus administration of intravenous contrast. Multiplanar reconstructed images and MIPs were obtained and reviewed to evaluate the vascular anatomy. RADIATION DOSE REDUCTION: This exam was performed according to the departmental dose-optimization program which includes automated exposure control, adjustment of the mA and/or kV according to patient size and/or use of iterative reconstruction technique. CONTRAST:  OMNIPAQUE IOHEXOL 350 MG/ML SOLN COMPARISON:  CT abdomen pelvis, 02/12/2017 FINDINGS: CTA CHEST FINDINGS VASCULAR Aorta: Satisfactory opacification of the aorta. Normal contour and caliber of the thoracic aorta. No evidence of aneurysm, dissection, or other acute aortic pathology. Mild aortic atherosclerosis. Cardiovascular: No evidence of pulmonary embolism on limited non-tailored examination. Normal heart size. Mild cardiomegaly. No pericardial effusion. Review of the MIP images confirms the above findings. NON VASCULAR Mediastinum/Nodes: No enlarged mediastinal, hilar, or axillary lymph nodes. Small hiatal hernia. Thyroid gland, trachea, and esophagus demonstrate no significant findings. Lungs/Pleura: Mild bibasilar scarring. Multiple small definitively benign calcified pulmonary nodules, requiring no further follow-up or characterization. No pleural effusion or pneumothorax. Musculoskeletal: No chest wall abnormality. No acute osseous findings. Review of the MIP images confirms the above findings. CTA ABDOMEN AND PELVIS FINDINGS VASCULAR Normal contour and caliber of the abdominal aorta. Mild mixed calcific atherosclerosis. No evidence  of aneurysm, dissection, or other acute aortic pathology. Standard branching pattern of the abdominal aorta with solitary bilateral renal arteries. Review of the MIP images confirms the above findings. NON-VASCULAR Hepatobiliary: No solid liver abnormality is seen. Hepatic steatosis. Small gallstones. No gallbladder wall thickening, or biliary dilatation. Pancreas: Unremarkable. No pancreatic ductal dilatation or surrounding inflammatory changes. Spleen: Normal in size without significant abnormality. Adrenals/Urinary Tract: Adrenal glands are unremarkable. Kidneys are normal, without renal calculi, solid lesion, or hydronephrosis. Bladder is unremarkable. Stomach/Bowel: Stomach is within normal limits. Diverticulum of the transverse duodenum. Appendix not clearly visualized. Fluid-filled, although nondistended loops of small bowel throughout the mid abdomen (series 4, image 174). No evidence of bowel wall thickening, distention, or inflammatory changes. Severe pancolonic diverticulosis. Lymphatic: No enlarged abdominal or pelvic lymph nodes. Reproductive: Prostatomegaly. Other: No abdominal wall hernia or abnormality. No ascites. Musculoskeletal: No acute osseous findings. 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 Normal contour and caliber of the thoracic and abdominal aorta. No evidence of aneurysm, dissection, or other acute aortic pathology. Mild aortic atherosclerosis. 2. Fluid-filled, nondistended loops of small bowel throughout the mid abdomen, suggesting nonspecific enteritis. No evidence of bowel obstruction. Gas and stool present to the rectum. 3. Severe pancolonic diverticulosis without evidence of acute diverticulitis. 4. Hepatic steatosis. 5. Cholelithiasis without evidence of acute cholecystitis. Aortic Atherosclerosis (ICD10-I70.0). Electronically  Signed   By: Donald Ball M.D.   On: 12/07/2023 10:40   IMPRESSION:  *Concern for upper GI  bleed with very dark emesis at home, starting Tuesday.  Hgb is normal.  BUN is up. *Enteritis: Symptoms initially started suddenly on Monday with diarrhea and now has findings of enteritis on CT scan. *GERD:  Does take BID PPI at home and occasional Tums as well.  Says the he had an EGD while at [facility] by Dr. Charm Barges in 2018 that was unremarkable at the time. *Hyponatremia:  Likely due to GI losses.  Looks like may be slightly low chronically as well and they say yes, it is chronically low.  Correction per primary service.  PLAN: - Continue pantoprazole 40 mg IV twice daily. - He is on Rocephin and Flagyl for enteritis. - Antiemetics as needed. - EGD at some point, timing TBD.  Seems that it will be 4/11 AM.  Can sip on clear liquids today then NPO after midnight. - Monitor labs.   Donald Ball. Donald Ball  12/08/2023, 8:49 AM  I have taken an interval history, thoroughly reviewed the chart and examined the patient. I agree with the Advanced Practitioner's note, impression and recommendations, and have recorded additional findings, impressions and recommendations below. I performed a substantive portion of this encounter (>50% time spent), including a complete performance of the medical decision making.  My additional thoughts are as follows:  Acute onset symptoms and overall clinical picture favors infectious illness such as food poisoning or viral gastroenteritis.  I think that also accounts for the CT appearance of the small bowel.  I do not think there is bacterial infection and his antibiotic can be stopped.  Difficult to know for sure if this reported black vomitus was blood or foodstuffs, as he also reported having had some kind of chocolate dairy dessert that may not agreed with him. Underlying reflux symptoms requiring chronic acid suppression.  Upper endoscopy makes sense to be sure there is no serious upper GI pathology that may have caused bleeding.  I am reassured by his  persistently normal hemoglobin.  Remainder of symptoms expected to improve with supportive care and time.  He was agreeable to an upper endoscopy that we tentatively have scheduled tomorrow with Dr. Leone Payor pending availability of endoscopist and endoscopy department.  The benefits and risks of the planned procedure(s) were described in detail with the patient or (when appropriate) their health care proxy.  Risks were outlined as including, but not limited to, bleeding, infection, perforation, adverse medication reaction leading to cardiac or pulmonary decompensation, pancreatitis (if ERCP).  The limitation of incomplete mucosal visualization was also discussed.  No guarantees or warranties were given.  Wife present for entire visit.   Donald Ball Office:[phone]

## 2023-12-08 NOTE — Plan of Care (Signed)

## 2023-12-08 NOTE — Progress Notes (Signed)
 Mobility Specialist - Progress Note   12/08/23 1323  Mobility  Activity Ambulated with assistance in hallway  Level of Assistance Modified independent, requires aide device or extra time  Assistive Device Front wheel walker  Distance Ambulated (ft) 350 ft  Activity Response Tolerated well  Mobility Referral Yes  Mobility visit 1 Mobility  Mobility Specialist Start Time (ACUTE ONLY) 1310  Mobility Specialist Stop Time (ACUTE ONLY) 1323  Mobility Specialist Time Calculation (min) (ACUTE ONLY) 13 min   Pt received in bed and agreeable to mobility. No complaints during session. Pt to EOB after session with all needs met.    [facility]

## 2023-12-08 NOTE — Progress Notes (Signed)
 Pt had one episode if dark emesis during the the night. Reglan given for nausea and vomiting. Pt has a congested cough, with clear pheglm suctioned from the suction cath. Will cont to monitor.

## 2023-12-08 NOTE — Progress Notes (Signed)
 [facility] Progress Note  Patient: Donald Ball     UJW:[id number]  DOA: 12/07/2023   PCP: Lise Auer, MD       Brief hospital course: This is an 88 year old male with hypertension & BPH who presents to the hospital for nausea, vomiting and loose stools.  The patient stated that he had a few episodes of black vomitus as well.  He did not notice any blood in his stools.  He has not had any fevers or chills but he was at a group event on Sunday which was catered. In the ED, CT scan revealed fluid-filled nondistended loops of small bowel throughout the mid abdomen suggesting nonspecific enteritis and cholelithiasis without evidence of cholecystitis. Sodium was 125, potassium 3.3, WBC count 12.5  Subjective:  Continues to be nauseated today and had an episode of vomiting this morning.  It was also dark in color.  Assessment and Plan: Principal Problem:   Enteritis, leukocytosis-possible GI bleed - Possibly contracted during the event he went to on Sunday - Continue clear liquid diet, antiemetics and antibiotics for possible bacterial infection - Continue to follow hemoglobin - Appreciate GI eval-plan for EGD tomorrow  Active Problems:   Hyponatremia - Based on history and physical exam, this appears to be due to dehydration - Urine sodium is less than 10 and urine osmolality is 827 - Holding HCTZ and giving IV fluids - Sodium has improved to 128  Hypertension - Continue carvedilol - As needed hydralazine ordered  Hypokalemia - Replaced  BPH - continue doxazosin and finasteride    Code Status: Full Code Total time on patient care: 35 minutes DVT prophylaxis:  SCDs Start: 12/07/23 1830     Objective:   Vitals:   12/08/23 0346 12/08/23 0628 12/08/23 0748 12/08/23 1201  BP: (!) 197/100 (!) 191/97 (!) 178/106 (!) 163/83  Pulse: 76 76  69  Resp: 17 18  17   Temp: 97.8 F (36.6 C) 97.9 F (36.6 C)  98 F (36.7 C)  TempSrc:  Oral  Oral  SpO2: 96% 96%  98%   Weight:      Height:       Filed Weights   12/07/23 0819  Weight: 105.7 kg   Exam: General exam: Appears comfortable  HEENT: oral mucosa moist Respiratory system: Clear to auscultation.  Cardiovascular system: S1 & S2 heard  Gastrointestinal system: Abdomen soft, non-tender, nondistended. Normal bowel sounds   Extremities: No cyanosis, clubbing or edema Psychiatry:  Mood & affect appropriate.      CBC: Recent Labs  Lab 12/07/23 0848 12/07/23 1902 12/08/23 0334  WBC 12.5* 13.0* 13.1*  NEUTROABS 9.4*  --   --   HGB 13.0 13.1 13.0  HCT 37.4* 39.2 39.0  MCV 84.0 88.3 88.6  PLT 256 245 258   Basic Metabolic Panel: Recent Labs  Lab 12/07/23 0848 12/07/23 0947 12/07/23 1902 12/08/23 0334 12/08/23 1047  NA 125* 125* 123* 125* 128*  K 3.3*  --  3.1* 3.8 3.8  CL 88*  --  88* 92* 93*  CO2 27  --  27 25 26   GLUCOSE 138*  --  146* 140* 113*  BUN 25*  --  30* 30* 29*  CREATININE 1.10  --  1.07 1.13 0.98  CALCIUM 9.4  --  8.9 8.9 8.6*     Scheduled Meds:  carvedilol  25 mg Oral BID WC   doxazosin  8 mg Oral Daily   finasteride  5 mg Oral Daily  latanoprost  1 drop Both Eyes QHS   multivitamin with minerals  1 tablet Oral Daily   ondansetron (ZOFRAN) IV  4 mg Intravenous Once   pantoprazole (PROTONIX) IV  40 mg Intravenous Q12H   sodium chloride flush  3 mL Intravenous Q12H    Imaging and lab data personally reviewed   Author: Calvert Cantor  12/08/2023 2:57 PM  To contact [facility]>   Check the care team in [facility] and look for the attending/consulting [facility] provider listed  Log into [email or web address] and use [facility] universal password   Go to> "[facility]"  and find provider  If you still have difficulty reaching the provider, please page the [facility] (Director on Call) for the Hospitalists listed on amion

## 2023-12-08 NOTE — Progress Notes (Signed)
   12/08/23 1216  TOC Brief Assessment  Insurance and Status Reviewed  Patient has primary care physician Yes  Home environment has been reviewed Resides at home with spouse  Prior level of function: Independent with ADLs at baseline  Prior/Current Home Services No current home services  Social Drivers of Health Review SDOH reviewed no interventions necessary  Readmission risk has been reviewed Yes  Transition of care needs no transition of care needs at this time

## 2023-12-09 ENCOUNTER — Encounter (HOSPITAL_COMMUNITY): Payer: Self-pay | Admitting: Internal Medicine

## 2023-12-09 ENCOUNTER — Inpatient Hospital Stay (HOSPITAL_COMMUNITY): Payer: Self-pay | Admitting: Certified Registered Nurse Anesthetist

## 2023-12-09 ENCOUNTER — Encounter (HOSPITAL_COMMUNITY)
Admission: EM | Disposition: A | Discharge status: Home / Self Care | Payer: Self-pay | Source: Home / Self Care | Attending: Internal Medicine

## 2023-12-09 DIAGNOSIS — K2289 Other specified disease of esophagus: Secondary | ICD-10-CM

## 2023-12-09 DIAGNOSIS — K920 Hematemesis: Secondary | ICD-10-CM

## 2023-12-09 DIAGNOSIS — K529 Noninfective gastroenteritis and colitis, unspecified: Secondary | ICD-10-CM | POA: Diagnosis not present

## 2023-12-09 DIAGNOSIS — K449 Diaphragmatic hernia without obstruction or gangrene: Secondary | ICD-10-CM | POA: Diagnosis not present

## 2023-12-09 DIAGNOSIS — K317 Polyp of stomach and duodenum: Secondary | ICD-10-CM

## 2023-12-09 DIAGNOSIS — E871 Hypo-osmolality and hyponatremia: Secondary | ICD-10-CM | POA: Diagnosis not present

## 2023-12-09 DIAGNOSIS — K3189 Other diseases of stomach and duodenum: Secondary | ICD-10-CM | POA: Diagnosis not present

## 2023-12-09 HISTORY — PX: ESOPHAGOGASTRODUODENOSCOPY: SHX5428

## 2023-12-09 LAB — BASIC METABOLIC PANEL WITH GFR
Anion gap: 9 (ref 5–15)
BUN: 23 mg/dL (ref 8–23)
CO2: 23 mmol/L (ref 22–32)
Calcium: 8.1 mg/dL — ABNORMAL LOW (ref 8.9–10.3)
Chloride: 98 mmol/L (ref 98–111)
Creatinine, Ser: 0.98 mg/dL (ref 0.61–1.24)
GFR, Estimated: >60 mL/min (ref >60)
Glucose, Bld: 93 mg/dL (ref 70–99)
Potassium: 3.3 mmol/L — ABNORMAL LOW (ref 3.5–5.1)
Sodium: 130 mmol/L — ABNORMAL LOW (ref 135–145)

## 2023-12-09 LAB — MAGNESIUM: Magnesium: 1.6 mg/dL — ABNORMAL LOW (ref 1.7–2.4)

## 2023-12-09 LAB — CBC
HCT: 36.4 % — ABNORMAL LOW (ref 39.0–52.0)
Hemoglobin: 11.8 g/dL — ABNORMAL LOW (ref 13.0–17.0)
MCH: 29.6 pg (ref 26.0–34.0)
MCHC: 32.4 g/dL (ref 30.0–36.0)
MCV: 91.5 fL (ref 80.0–100.0)
Platelets: 197 10*3/uL (ref 150–400)
RBC: 3.98 MIL/uL — ABNORMAL LOW (ref 4.22–5.81)
RDW: 14.4 % (ref 11.5–15.5)
WBC: 10 10*3/uL (ref 4.0–10.5)
nRBC: 0 % (ref 0.0–0.2)

## 2023-12-09 SURGERY — EGD (ESOPHAGOGASTRODUODENOSCOPY)
Anesthesia: Monitor Anesthesia Care

## 2023-12-09 MED ORDER — MAGNESIUM SULFATE 4 GM/100ML IV SOLN
4.0000 g | Freq: Once | INTRAVENOUS | Status: AC
  Administered 2023-12-09: 4 g via INTRAVENOUS
  Filled 2023-12-09: qty 100

## 2023-12-09 MED ORDER — SODIUM CHLORIDE 0.9 % IV SOLN
INTRAVENOUS | Status: AC | PRN
  Administered 2023-12-09: 250 mL via INTRAVENOUS

## 2023-12-09 MED ORDER — LIDOCAINE 2% (20 MG/ML) 5 ML SYRINGE
INTRAMUSCULAR | Status: DC | PRN
  Administered 2023-12-09: 50 mg via INTRAVENOUS

## 2023-12-09 MED ORDER — PANTOPRAZOLE SODIUM 40 MG PO TBEC
40.0000 mg | DELAYED_RELEASE_TABLET | Freq: Two times a day (BID) | ORAL | Status: DC
  Administered 2023-12-09 – 2023-12-10 (×2): 40 mg via ORAL
  Filled 2023-12-09 (×3): qty 1

## 2023-12-09 MED ORDER — POTASSIUM CHLORIDE CRYS ER 20 MEQ PO TBCR
40.0000 meq | EXTENDED_RELEASE_TABLET | ORAL | Status: AC
  Administered 2023-12-09 (×2): 40 meq via ORAL
  Filled 2023-12-09 (×2): qty 2

## 2023-12-09 MED ORDER — HYDRALAZINE HCL 20 MG/ML IJ SOLN
10.0000 mg | Freq: Once | INTRAMUSCULAR | Status: AC
  Administered 2023-12-09: 10 mg via INTRAVENOUS
  Filled 2023-12-09: qty 1

## 2023-12-09 MED ORDER — PROPOFOL 10 MG/ML IV BOLUS
INTRAVENOUS | Status: DC | PRN
  Administered 2023-12-09: 30 mg via INTRAVENOUS

## 2023-12-09 MED ORDER — PROPOFOL 500 MG/50ML IV EMUL
INTRAVENOUS | Status: DC | PRN
  Administered 2023-12-09: 125 ug/kg/min via INTRAVENOUS

## 2023-12-09 NOTE — Transfer of Care (Signed)
 Immediate Anesthesia Transfer of Care Note  Patient: Donald Ball  Procedure(s) Performed: EGD (ESOPHAGOGASTRODUODENOSCOPY)  Patient Location: Endoscopy Unit  Anesthesia Type:MAC  Level of Consciousness: drowsy  Airway & Oxygen Therapy: Patient Spontanous Breathing and Patient connected to face mask  Post-op Assessment: Report given to RN and Post -op Vital signs reviewed and stable  Post vital signs: Reviewed and stable  Last Vitals:  Vitals Value Taken Time  BP 133/66 12/09/23 1010  Temp    Pulse 65 12/09/23 1011  Resp 22 12/09/23 1011  SpO2 100 % 12/09/23 1011  Vitals shown include unfiled device data.  Last Pain:  Vitals:   12/09/23 0931  TempSrc:   PainSc: 0-No pain      Patients Stated Pain Goal: 0 (12/07/23 2120)  Complications: No notable events documented.

## 2023-12-09 NOTE — Interval H&P Note (Signed)
 History and Physical Interval Note:  12/09/2023 9:38 AM  Donald Ball  has presented today for surgery, with the diagnosis of Upper gastrointestinal bleeding.  The various methods of treatment have been discussed with the patient and family. After consideration of risks, benefits and other options for treatment, the patient has consented to  Procedure(s): EGD (ESOPHAGOGASTRODUODENOSCOPY) (N/A) as a surgical intervention.  The patient's history has been reviewed, patient examined, no change in status, stable for surgery.  I have reviewed the patient's chart and labs.  Questions were answered to the patient's satisfaction.     Stan Head

## 2023-12-09 NOTE — Anesthesia Procedure Notes (Signed)
 Procedure Name: MAC Date/Time: 12/09/2023 9:45 AM  Performed by: Vanessa Granjeno, CRNAPre-anesthesia Checklist: Patient identified, Emergency Drugs available, Suction available and Patient being monitored Patient Re-evaluated:Patient Re-evaluated prior to induction Oxygen Delivery Method: Nasal cannula

## 2023-12-09 NOTE — Op Note (Addendum)
 [facility] Patient Name: Donald Ball Procedure Date: 12/09/2023 MRN: [id number] Attending MD: Iva Boop , MD, [id number] Date of Birth: 1936/07/24 CSN: [id number] Age: 88 Admit Type: Inpatient Procedure:                Upper GI endoscopy Indications:              Coffee-ground emesis in the setting of suspected                            gastroenteritis Providers:                Iva Boop, MD, Marge Duncans, RN, Harrington Challenger, Technician Referring MD:              Medicines:                Monitored Anesthesia Care Complications:            No immediate complications. Estimated Blood Loss:     Estimated blood loss was minimal. Procedure:                Pre-Anesthesia Assessment:                           - Prior to the procedure, a History and Physical                            was performed, and patient medications and                            allergies were reviewed. The patient's tolerance of                            previous anesthesia was also reviewed. The risks                            and benefits of the procedure and the sedation                            options and risks were discussed with the patient.                            All questions were answered, and informed consent                            was obtained. Prior Anticoagulants: The patient has                            taken no anticoagulant or antiplatelet agents. ASA                            Grade Assessment: II - A patient with mild systemic  disease. After reviewing the risks and benefits,                            the patient was deemed in satisfactory condition to                            undergo the procedure.                           After obtaining informed consent, the endoscope was                            passed under direct vision. Throughout the                            procedure, the patient's  blood pressure, pulse, and                            oxygen saturations were monitored continuously. The                            [id number]-H190 ([id number]) Olympus endoscope was introduced                            through the mouth, and advanced to the second part                            of duodenum. The upper GI endoscopy was                            accomplished without difficulty. The patient                            tolerated the procedure well. Scope In: Scope Out: Findings:      The Z-line was irregular and was found 35 cm from the incisors.      A 5 cm sliding hiatal hernia was found.      Multiple small semi-sessile polyps with no stigmata of recent bleeding       were found in the gastric fundus and in the gastric body. Biopsies were       taken with a cold forceps for histology. Verification of patient       identification for the specimen was done. Estimated blood loss was       minimal.      A deformity was found on the anterior wall of the gastric body. Firm       submucosal bulge. Approximately 3 cm size      Patchy mildly erythematous mucosa without bleeding was found in the       gastric antrum. Biopsies were taken with a cold forceps for histology.       Verification of patient identification for the specimen was done.       Estimated blood loss was minimal.      The exam was otherwise without abnormality.      The cardia and gastric fundus were otherwise normal on retroflexion. Impression:               -  Z-line irregular, 35 cm from the incisors.                           - 5 cm sliding hiatal hernia.                           - Multiple gastric polyps. Biopsied. Look like                            innocent fundic gland polyps                           - Firm submucosal bulge deformity in the gastric                            body (anterior wall). review of CT-angio                            chest/abd/pelvis does not show this though left                             lobe of liver in proximity of stomach so ? if this                            is liver impression.                           - Erythematous mucosa in the antrum. Biopsied.                           - The examination was otherwise normal. No bleeding                            lesions seen. Dark emesis likely secondary to the                            gastroenteritis. There were some retained                            medications and tenacious mucous deposits - mostly                            cleared, exam adeauqte. Moderate Sedation:      Not Applicable - Patient had care per Anesthesia. Recommendation:           - Clear liquids, advance as tolerated                           I will f/u pathology                           dc Abx                           Submucosal bulge could need dedicated CT vs EUS - I  will review CT with radiology and decide though CT                            would not have to be done in hospital necessarily -                            I was able to discuss w/ radilogist and they are                            confident gastric wall is normal on the CT-A and                            doubt this is the liver causing impression. Will                            discuss EUS after pathology review.                           DC Abx                           bid PPI                           Dr. Barron Alvine to see tomorrow Procedure Code(s):        --- Professional ---                           [phone], Esophagogastroduodenoscopy, flexible,                            transoral; with biopsy, single or multiple Diagnosis Code(s):        --- Professional ---                           K22.89, Other specified disease of esophagus                           K44.9, Diaphragmatic hernia without obstruction or                            gangrene                           K31.7, Polyp of stomach and duodenum                           K31.89,  Other diseases of stomach and duodenum                           K92.0, Hematemesis CPT copyright 2022 [employer]. All rights reserved. The codes documented in this report are preliminary and upon coder review may  be revised to meet current compliance requirements. Iva Boop, MD 12/09/2023 10:31:02 AM This report has been signed electronically. Number of Addenda: 0

## 2023-12-09 NOTE — Plan of Care (Signed)
   Problem: Clinical Measurements: Goal: Will remain free from infection Outcome: Progressing   Problem: Clinical Measurements: Goal: Diagnostic test results will improve Outcome: Progressing   Problem: Clinical Measurements: Goal: Respiratory complications will improve Outcome: Progressing   Problem: Clinical Measurements: Goal: Cardiovascular complication will be avoided Outcome: Progressing

## 2023-12-09 NOTE — Progress Notes (Signed)
 [facility] Progress Note  Patient: Donald Ball     WUJ:[id number]  DOA: 12/07/2023   PCP: Lise Auer, MD       Brief hospital course: This is an 88 year old male with hypertension & BPH who presents to the hospital for nausea, vomiting and loose stools.  The patient stated that he had a few episodes of black vomitus as well.  He did not notice any blood in his stools.  He has not had any fevers or chills but he was at a group event on Sunday which was catered. In the ED, CT scan revealed fluid-filled nondistended loops of small bowel throughout the mid abdomen suggesting nonspecific enteritis and cholelithiasis without evidence of cholecystitis. Sodium was 125, potassium 3.3, WBC count 12.5  Subjective:  No further diarrhea or vomiting. Would like to try solid food.   Assessment and Plan: Principal Problem:   Enteritis, leukocytosis-possible GI bleed - Possibly contracted during the event he went to on Sunday - Continue antibiotics for possible bacterial infection  EGD done today shows no stigmata of bleed- gastric polyps were biopsied - start regular diet today and follow  Active Problems:   Hyponatremia - Based on history and physical exam, this appears to be due to dehydration - Urine sodium is less than 10 and urine osmolality is 827 - Holding HCTZ and giving IV fluids - Sodium has improved to 130- cut back on IVF   Hypertension - Continue carvedilol - As needed hydralazine ordered  Hypokalemia, hypomagnesemia - Replaced  BPH - continue doxazosin and finasteride    Code Status: Full Code Total time on patient care: 35 minutes DVT prophylaxis:  SCDs Start: 12/07/23 1830     Objective:   Vitals:   12/09/23 1020 12/09/23 1030 12/09/23 1100 12/09/23 1300  BP: (!) 153/69 (!) 167/87 (!) 175/95   Pulse: 65 71 66 76  Resp: 15 19    Temp:   98.2 F (36.8 C)   TempSrc:   Oral   SpO2: 99% 96% 100%   Weight:      Height:       Filed Weights    12/07/23 0819  Weight: 105.7 kg   Exam: General exam: Appears comfortable  HEENT: oral mucosa moist Respiratory system: Clear to auscultation.  Cardiovascular system: S1 & S2 heard  Gastrointestinal system: Abdomen soft, non-tender, moderately distended, normal bowel sounds   Extremities: No cyanosis, clubbing or edema Psychiatry:  Mood & affect appropriate.      CBC: Recent Labs  Lab 12/07/23 0848 12/07/23 1902 12/08/23 0334 12/09/23 0321  WBC 12.5* 13.0* 13.1* 10.0  NEUTROABS 9.4*  --   --   --   HGB 13.0 13.1 13.0 11.8*  HCT 37.4* 39.2 39.0 36.4*  MCV 84.0 88.3 88.6 91.5  PLT 256 245 258 197   Basic Metabolic Panel: Recent Labs  Lab 12/07/23 0848 12/07/23 0947 12/07/23 1902 12/08/23 0334 12/08/23 1047 12/09/23 0321 12/09/23 0328  NA 125* 125* 123* 125* 128* 130*  --   K 3.3*  --  3.1* 3.8 3.8 3.3*  --   CL 88*  --  88* 92* 93* 98  --   CO2 27  --  27 25 26 23   --   GLUCOSE 138*  --  146* 140* 113* 93  --   BUN 25*  --  30* 30* 29* 23  --   CREATININE 1.10  --  1.07 1.13 0.98 0.98  --   CALCIUM 9.4  --  8.9 8.9 8.6* 8.1*  --   MG  --   --   --   --   --   --  1.6*     Scheduled Meds:  carvedilol  25 mg Oral BID WC   doxazosin  8 mg Oral Daily   finasteride  5 mg Oral Daily   latanoprost  1 drop Both Eyes QHS   multivitamin with minerals  1 tablet Oral Daily   ondansetron (ZOFRAN) IV  4 mg Intravenous Once   pantoprazole  40 mg Oral BID AC   sodium chloride flush  3 mL Intravenous Q12H    Imaging and lab data personally reviewed   Author: Calvert Cantor  12/09/2023 2:22 PM  To contact [facility]>   Check the care team in [facility] and look for the attending/consulting [facility] provider listed  Log into [email or web address] and use [facility]'s universal password   Go to> "[facility]"  and find provider  If you still have difficulty reaching the provider, please page the [facility] (Director on Call) for the Hospitalists listed on amion

## 2023-12-09 NOTE — Anesthesia Postprocedure Evaluation (Signed)
 Anesthesia Post Note  Patient: Donald Ball  Procedure(s) Performed: EGD (ESOPHAGOGASTRODUODENOSCOPY)     Patient location during evaluation: PACU Anesthesia Type: MAC Level of consciousness: awake and alert Pain management: pain level controlled Vital Signs Assessment: post-procedure vital signs reviewed and stable Respiratory status: spontaneous breathing, nonlabored ventilation, respiratory function stable and patient connected to nasal cannula oxygen Cardiovascular status: stable and blood pressure returned to baseline Postop Assessment: no apparent nausea or vomiting Anesthetic complications: no   No notable events documented.  Last Vitals:  Vitals:   12/09/23 1100 12/09/23 1300  BP: (!) 175/95   Pulse: 66 76  Resp:    Temp: 36.8 C   SpO2: 100%     Last Pain:  Vitals:   12/09/23 1100  TempSrc: Oral  PainSc: 0-No pain                 Earl Lites P Judye Lorino

## 2023-12-09 NOTE — Plan of Care (Signed)
   Problem: Clinical Measurements: Goal: Will remain free from infection Outcome: Progressing   Problem: Pain Managment: Goal: General experience of comfort will improve and/or be controlled Outcome: Progressing   Problem: Safety: Goal: Ability to remain free from injury will improve Outcome: Progressing

## 2023-12-09 NOTE — Anesthesia Preprocedure Evaluation (Signed)
 Anesthesia Evaluation  Patient identified by MRN, date of birth, ID band Patient awake    Reviewed: Allergy & Precautions, NPO status , Patient's Chart, lab work & pertinent test results  Airway Mallampati: II  TM Distance: >3 FB Neck ROM: Full    Dental no notable dental hx.    Pulmonary neg pulmonary ROS   Pulmonary exam normal        Cardiovascular hypertension, Pt. on medications and Pt. on home beta blockers  Rhythm:Regular Rate:Normal     Neuro/Psych negative neurological ROS  negative psych ROS   GI/Hepatic Neg liver ROS,GERD  Medicated,,GIB   Endo/Other    Renal/GU negative Renal ROS  negative genitourinary   Musculoskeletal   Abdominal Normal abdominal exam  (+)   Peds  Hematology  (+) Blood dyscrasia, anemia Lab Results      Component                Value               Date                      WBC                      10.0                12/09/2023                HGB                      11.8 (L)            12/09/2023                HCT                      36.4 (L)            12/09/2023                MCV                      91.5                12/09/2023                PLT                      197                 12/09/2023              Anesthesia Other Findings   Reproductive/Obstetrics                             Anesthesia Physical Anesthesia Plan  ASA: 2  Anesthesia Plan: MAC   Post-op Pain Management:    Induction:   PONV Risk Score and Plan: 1 and Propofol infusion and Treatment may vary due to age or medical condition  Airway Management Planned: Simple Face Mask and Nasal Cannula  Additional Equipment: None  Intra-op Plan:   Post-operative Plan:   Informed Consent: I have reviewed the patients History and Physical, chart, labs and discussed the procedure including the risks, benefits and alternatives for the proposed anesthesia with the patient or  authorized representative who has indicated his/her understanding and acceptance.  Dental advisory given  Plan Discussed with: CRNA  Anesthesia Plan Comments:        Anesthesia Quick Evaluation

## 2023-12-10 DIAGNOSIS — K529 Noninfective gastroenteritis and colitis, unspecified: Secondary | ICD-10-CM | POA: Diagnosis not present

## 2023-12-10 DIAGNOSIS — I10 Essential (primary) hypertension: Secondary | ICD-10-CM

## 2023-12-10 DIAGNOSIS — K2970 Gastritis, unspecified, without bleeding: Secondary | ICD-10-CM

## 2023-12-10 LAB — BASIC METABOLIC PANEL WITH GFR
Anion gap: 7 (ref 5–15)
BUN: 19 mg/dL (ref 8–23)
CO2: 25 mmol/L (ref 22–32)
Calcium: 7.9 mg/dL — ABNORMAL LOW (ref 8.9–10.3)
Chloride: 98 mmol/L (ref 98–111)
Creatinine, Ser: 1.02 mg/dL (ref 0.61–1.24)
GFR, Estimated: >60 mL/min (ref >60)
Glucose, Bld: 111 mg/dL — ABNORMAL HIGH (ref 70–99)
Potassium: 3.7 mmol/L (ref 3.5–5.1)
Sodium: 130 mmol/L — ABNORMAL LOW (ref 135–145)

## 2023-12-10 LAB — CBC
HCT: 33.9 % — ABNORMAL LOW (ref 39.0–52.0)
Hemoglobin: 11.1 g/dL — ABNORMAL LOW (ref 13.0–17.0)
MCH: 29.4 pg (ref 26.0–34.0)
MCHC: 32.7 g/dL (ref 30.0–36.0)
MCV: 89.9 fL (ref 80.0–100.0)
Platelets: 195 10*3/uL (ref 150–400)
RBC: 3.77 MIL/uL — ABNORMAL LOW (ref 4.22–5.81)
RDW: 14.9 % (ref 11.5–15.5)
WBC: 9.3 10*3/uL (ref 4.0–10.5)
nRBC: 0 % (ref 0.0–0.2)

## 2023-12-10 LAB — MAGNESIUM: Magnesium: 2.5 mg/dL — ABNORMAL HIGH (ref 1.7–2.4)

## 2023-12-10 MED ORDER — OMEPRAZOLE 20 MG PO CPDR: 40.0000 mg | DELAYED_RELEASE_CAPSULE | Freq: Two times a day (BID) | ORAL | 0 refills | Status: AC

## 2023-12-10 MED ORDER — HYDRALAZINE HCL 20 MG/ML IJ SOLN
10.0000 mg | Freq: Once | INTRAMUSCULAR | Status: AC
  Administered 2023-12-10: 10 mg via INTRAVENOUS

## 2023-12-10 MED ORDER — OLMESARTAN MEDOXOMIL 40 MG PO TABS: 40.0000 mg | ORAL_TABLET | Freq: Every day | ORAL | 11 refills | Status: AC

## 2023-12-10 MED ORDER — DOXAZOSIN MESYLATE 4 MG PO TABS: 4.0000 mg | ORAL_TABLET | Freq: Every day | ORAL | Status: DC

## 2023-12-10 MED ORDER — IRBESARTAN 150 MG PO TABS
75.0000 mg | ORAL_TABLET | Freq: Every day | ORAL | Status: DC
  Administered 2023-12-10: 75 mg via ORAL
  Filled 2023-12-10: qty 1

## 2023-12-10 NOTE — Progress Notes (Signed)
 [facility] ROUNDING NOTE   Subjective: EGD yesterday notable for 5 cm hiatal hernia, gastric polyps, and mild antral gastritis (path pending), without any active bleeding or stigmata of recent bleeding.  Otherwise no acute events overnight.  Tolerating soft foods without issue.  No more abdominal pain.  No nausea/vomiting/diarrhea.   Objective: Vital signs in last 24 hours: Temp:  [97.5 F (36.4 C)-98.2 F (36.8 C)] 98.1 F (36.7 C) (04/12 0745) Pulse Rate:  [65-123] 123 (04/12 0745) Resp:  [15-26] 20 (04/12 0745) BP: (133-207)/(66-96) 185/96 (04/12 0745) SpO2:  [95 %-100 %] 96 % (04/12 0745) Last BM Date : 12/08/23 General: NAD Lungs:  CTA b/l, no w/r/r Heart:  RRR, no m/r/g Abdomen:  Soft, NT, ND, +BS   Intake/Output from previous day: 04/11 0701 - 04/12 0700 In: 3095.3 [P.O.:220; I.V.:2675.3; IV Piggyback:200] Out: 375 [Urine:375] Intake/Output this shift: No intake/output data recorded.   Lab Results: Recent Labs    12/08/23 0334 12/09/23 0321 12/10/23 0403  WBC 13.1* 10.0 9.3  HGB 13.0 11.8* 11.1*  PLT 258 197 195  MCV 88.6 91.5 89.9   BMET Recent Labs    12/08/23 1047 12/09/23 0321 12/10/23 0403  NA 128* 130* 130*  K 3.8 3.3* 3.7  CL 93* 98 98  CO2 26 23 25   GLUCOSE 113* 93 111*  BUN 29* 23 19  CREATININE 0.98 0.98 1.02  CALCIUM 8.6* 8.1* 7.9*   LFT No results for input(s): "PROT", "ALBUMIN", "AST", "ALT", "ALKPHOS", "BILITOT", "BILIDIR", "IBILI" in the last 72 hours. PT/INR Recent Labs    12/07/23 2034  INR 1.1      Imaging/Other results: No results found.    Assessment and Plan:  1) Gastritis 2) Abdominal pain-resolved 3) Diarrhea-resolved 4) Nausea/vomiting-resolved  Suspect infectious gastroenteritis, and now clinically improving.  Tolerating soft foods without issue.  Main issue now is BP control.  EGD yesterday with gastritis and hiatal hernia, along with benign-appearing polyps (all path pending).  - Okay to  advance diet as tolerated - Ambulate around wards - Okay for discharge home from a GI standpoint - Continue omeprazole 40 mg twice daily as outpatient (was already taking for reflux) - Will follow-up on pending path results  5) Hypertension - Management per primary Hospital service  Inpatient GI service will sign off at this time.  Please do not hesitate to contact us  with additional questions or concerns  Annis Kinder, DO  12/10/2023, 9:36 AM [facility] Pager [phone]

## 2023-12-10 NOTE — Progress Notes (Signed)
   12/10/23 0745  Assess: MEWS Score  Temp 98.1 F (36.7 C)  BP (!) 185/96 (given scheduled coreg)  MAP (mmHg) 121  Pulse Rate (!) 123  ECG Heart Rate (!) 123  Resp 20  SpO2 96 %  O2 Device Room Air  Assess: MEWS Score  MEWS Temp 0  MEWS Systolic 0  MEWS Pulse 2  MEWS RR 0  MEWS LOC 0  MEWS Score 2  MEWS Score Color Yellow  Assess: if the MEWS score is Yellow or Red  Were vital signs accurate and taken at a resting state? Yes  Does the patient meet 2 or more of the SIRS criteria? No  Does the patient have a confirmed or suspected source of infection? No  MEWS guidelines implemented  Yes, yellow  Treat  MEWS Interventions Considered administering scheduled or prn medications/treatments as ordered  Take Vital Signs  Increase Vital Sign Frequency  Yellow: Q2hr x1, continue Q4hrs until patient remains green for 12hrs  Escalate  MEWS: Escalate Yellow: Discuss with charge nurse and consider notifying provider and/or RRT  Provider Notification  Provider Name/Title Dr. Renie Carver  Date Provider Notified 12/10/23  Time Provider Notified [phone]  Method of Notification Page  Notification Reason Change in status (high BP, yellow mews)  Provider response No new orders  Date of Provider Response 12/10/23  Time of Provider Response 0737  Assess: SIRS CRITERIA  SIRS Temperature  0  SIRS Respirations  0  SIRS Pulse 1  SIRS WBC 0  SIRS Score Sum  1

## 2023-12-10 NOTE — Discharge Summary (Signed)
 Physician Discharge Summary  Donald Ball ZOX:[id number] DOB: September 18, 1935 DOA: 12/07/2023  PCP: Beecher Bower, MD  Admit date: 12/07/2023 Discharge date: 12/10/2023 Discharging to: Home Recommendations for Outpatient Follow-up:  Can resume HCTZ once sodium improves Biopsy performed during EGD will need to be followed  Consults:  GI Procedures:  EGD   Discharge Diagnoses:   Principal Problem:   Enteritis Active Problems:   Hyponatremia   Coffee ground emesis   Hiatal hernia   Gastric polyps    Brief hospital course: This is an 88 year old male with hypertension & BPH who presents to the hospital for nausea, vomiting and loose stools.  The patient stated that he had a few episodes of black vomitus as well.  He did not notice any blood in his stools.  He has not had any fevers or chills but he was at a group event on Sunday which was catered. In the ED, CT scan revealed fluid-filled nondistended loops of small bowel throughout the mid abdomen suggesting nonspecific enteritis and cholelithiasis without evidence of cholecystitis. Sodium was 125, potassium 3.3, WBC count 12.5   Subjective:  No further diarrhea or vomiting. Would like to try solid food.    Assessment and Plan: Principal Problem:   Enteritis, leukocytosis-possible GI bleed with possible acute blood loss anemia - Possibly contracted during the event he went to on Sunday  -4/11 EGD shows no stigmata of bleed- gastric polyps were biopsied - Hemoglobin dropped from 13-11.1 - While in the hospital he was given IV antibiotics for possible bacterial gastroenteritis-he had no diarrhea in the hospital and antibiotics were discontinued - Has been advanced to regular diet as of yesterday and is tolerating this well without any symptoms   Active Problems:   Hyponatremia, hypokalemia, hypomagnesemia - Based on history and physical exam, hyponatremia appears to be due to dehydration - Urine sodium is less than 10 and urine  osmolality is 827 which correlates with dehydration - Holding HCTZ - Sodium improved after IV fluids - Sodium has improved to 130 today - Will DC HCTZ for now-can be resumed once sodium improves completely - Potassium improved to 3.7 and magnesium improved to 2.5   Hypertension - Continue carvedilol and Benicar - As needed hydralazine ordered while in the hospital - HCTZ on hold     BPH - continue doxazosin and finasteride             Discharge Instructions  Discharge Instructions     Diet - low sodium heart healthy   Complete by: As directed    Increase activity slowly   Complete by: As directed    No wound care   Complete by: As directed       Allergies as of 12/10/2023       Reactions   Altace [ramipril]    Hydralazine    Nisoldipine    REACTION: skin turns red   Norvasc [amlodipine Besylate]    Sular [nisoldipine Er]    Penicillins    REACTION: blisters        Medication List     STOP taking these medications    olmesartan-hydrochlorothiazide 40-12.5 MG tablet Commonly known as: BENICAR HCT       TAKE these medications    ascorbic acid 500 MG tablet Commonly known as: VITAMIN C Take 500 mg by mouth daily.   carvedilol 25 MG tablet Commonly known as: COREG Take 25 mg by mouth 2 (two) times daily with a meal.   doxazosin 8 MG  tablet Commonly known as: CARDURA Take 8 mg by mouth daily. What changed: Another medication with the same name was removed. Continue taking this medication, and follow the directions you see here.   finasteride 5 MG tablet Commonly known as: PROSCAR Take 5 mg by mouth daily.   latanoprost 0.005 % ophthalmic solution Commonly known as: XALATAN Place 1 drop into both eyes at bedtime.   multivitamin tablet Take 1 tablet by mouth daily.   olmesartan 40 MG tablet Commonly known as: BENICAR Take 1 tablet (40 mg total) by mouth daily.   omeprazole 20 MG capsule Commonly known as: PRILOSEC Take 20 mg by mouth  2 (two) times daily before a meal.            The results of significant diagnostics from this hospitalization (including imaging, microbiology, ancillary and laboratory) are listed below for reference.    CT Angio Chest/Abd/Pel for Dissection W and/or Wo Contrast Result Date: 12/07/2023 CLINICAL DATA:  Acute aortic syndrome suspected, diarrhea, fatigue, achiness, dark emesis EXAM: CT ANGIOGRAPHY CHEST, ABDOMEN AND PELVIS TECHNIQUE: Non-contrast CT of the chest was initially obtained. Multidetector CT imaging through the chest, abdomen and pelvis was performed using the standard protocol during bolus administration of intravenous contrast. Multiplanar reconstructed images and MIPs were obtained and reviewed to evaluate the vascular anatomy. RADIATION DOSE REDUCTION: This exam was performed according to the departmental dose-optimization program which includes automated exposure control, adjustment of the mA and/or kV according to patient size and/or use of iterative reconstruction technique. CONTRAST:  OMNIPAQUE IOHEXOL 350 MG/ML SOLN COMPARISON:  CT abdomen pelvis, 02/12/2017 FINDINGS: CTA CHEST FINDINGS VASCULAR Aorta: Satisfactory opacification of the aorta. Normal contour and caliber of the thoracic aorta. No evidence of aneurysm, dissection, or other acute aortic pathology. Mild aortic atherosclerosis. Cardiovascular: No evidence of pulmonary embolism on limited non-tailored examination. Normal heart size. Mild cardiomegaly. No pericardial effusion. Review of the MIP images confirms the above findings. NON VASCULAR Mediastinum/Nodes: No enlarged mediastinal, hilar, or axillary lymph nodes. Small hiatal hernia. Thyroid gland, trachea, and esophagus demonstrate no significant findings. Lungs/Pleura: Mild bibasilar scarring. Multiple small definitively benign calcified pulmonary nodules, requiring no further follow-up or characterization. No pleural effusion or pneumothorax. Musculoskeletal: No  chest wall abnormality. No acute osseous findings. Review of the MIP images confirms the above findings. CTA ABDOMEN AND PELVIS FINDINGS VASCULAR Normal contour and caliber of the abdominal aorta. Mild mixed calcific atherosclerosis. No evidence of aneurysm, dissection, or other acute aortic pathology. Standard branching pattern of the abdominal aorta with solitary bilateral renal arteries. Review of the MIP images confirms the above findings. NON-VASCULAR Hepatobiliary: No solid liver abnormality is seen. Hepatic steatosis. Small gallstones. No gallbladder wall thickening, or biliary dilatation. Pancreas: Unremarkable. No pancreatic ductal dilatation or surrounding inflammatory changes. Spleen: Normal in size without significant abnormality. Adrenals/Urinary Tract: Adrenal glands are unremarkable. Kidneys are normal, without renal calculi, solid lesion, or hydronephrosis. Bladder is unremarkable. Stomach/Bowel: Stomach is within normal limits. Diverticulum of the transverse duodenum. Appendix not clearly visualized. Fluid-filled, although nondistended loops of small bowel throughout the mid abdomen (series 4, image 174). No evidence of bowel wall thickening, distention, or inflammatory changes. Severe pancolonic diverticulosis. Lymphatic: No enlarged abdominal or pelvic lymph nodes. Reproductive: Prostatomegaly. Other: No abdominal wall hernia or abnormality. No ascites. Musculoskeletal: No acute osseous findings. 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 Normal contour and caliber of the thoracic and abdominal aorta. No evidence of aneurysm, dissection, or  other acute aortic pathology. Mild aortic atherosclerosis. 2. Fluid-filled, nondistended loops of small bowel throughout the mid abdomen, suggesting nonspecific enteritis. No evidence of bowel obstruction. Gas and stool present to the rectum. 3. Severe pancolonic diverticulosis without evidence of acute  diverticulitis. 4. Hepatic steatosis. 5. Cholelithiasis without evidence of acute cholecystitis. Aortic Atherosclerosis (ICD10-I70.0). Electronically Signed   By: Fredricka Jenny M.D.   On: 12/07/2023 10:40   Labs:   Basic Metabolic Panel: Recent Labs  Lab 12/07/23 1902 12/08/23 0334 12/08/23 1047 12/09/23 0321 12/09/23 0328 12/10/23 0403  NA 123* 125* 128* 130*  --  130*  K 3.1* 3.8 3.8 3.3*  --  3.7  CL 88* 92* 93* 98  --  98  CO2 27 25 26 23   --  25  GLUCOSE 146* 140* 113* 93  --  111*  BUN 30* 30* 29* 23  --  19  CREATININE 1.07 1.13 0.98 0.98  --  1.02  CALCIUM 8.9 8.9 8.6* 8.1*  --  7.9*  MG  --   --   --   --  1.6* 2.5*     CBC: Recent Labs  Lab 12/07/23 0848 12/07/23 1902 12/08/23 0334 12/09/23 0321 12/10/23 0403  WBC 12.5* 13.0* 13.1* 10.0 9.3  NEUTROABS 9.4*  --   --   --   --   HGB 13.0 13.1 13.0 11.8* 11.1*  HCT 37.4* 39.2 39.0 36.4* 33.9*  MCV 84.0 88.3 88.6 91.5 89.9  PLT 256 245 258 197 195         SIGNED:   Sedalia Dacosta, MD  [facility] 12/10/2023, 10:11 AM Time taking on discharge: 50 minutes

## 2023-12-11 ENCOUNTER — Encounter (HOSPITAL_COMMUNITY): Payer: Self-pay | Admitting: Internal Medicine

## 2023-12-13 LAB — SURGICAL PATHOLOGY

## 2023-12-14 DIAGNOSIS — I1A0 Resistant hypertension: Secondary | ICD-10-CM | POA: Diagnosis not present

## 2023-12-14 DIAGNOSIS — D62 Acute posthemorrhagic anemia: Secondary | ICD-10-CM | POA: Diagnosis not present

## 2023-12-14 DIAGNOSIS — E871 Hypo-osmolality and hyponatremia: Secondary | ICD-10-CM | POA: Diagnosis not present

## 2023-12-14 DIAGNOSIS — Z6827 Body mass index (BMI) 27.0-27.9, adult: Secondary | ICD-10-CM | POA: Diagnosis not present

## 2023-12-17 DIAGNOSIS — I10 Essential (primary) hypertension: Secondary | ICD-10-CM | POA: Diagnosis not present

## 2023-12-17 DIAGNOSIS — K922 Gastrointestinal hemorrhage, unspecified: Secondary | ICD-10-CM | POA: Diagnosis not present

## 2023-12-17 DIAGNOSIS — D62 Acute posthemorrhagic anemia: Secondary | ICD-10-CM | POA: Diagnosis not present

## 2023-12-17 DIAGNOSIS — R55 Syncope and collapse: Secondary | ICD-10-CM | POA: Diagnosis not present

## 2023-12-18 DIAGNOSIS — E7800 Pure hypercholesterolemia, unspecified: Secondary | ICD-10-CM | POA: Diagnosis not present

## 2023-12-18 DIAGNOSIS — K635 Polyp of colon: Secondary | ICD-10-CM | POA: Diagnosis not present

## 2023-12-18 DIAGNOSIS — K5750 Diverticulosis of both small and large intestine without perforation or abscess without bleeding: Secondary | ICD-10-CM | POA: Diagnosis not present

## 2023-12-18 DIAGNOSIS — K922 Gastrointestinal hemorrhage, unspecified: Secondary | ICD-10-CM | POA: Diagnosis not present

## 2023-12-18 DIAGNOSIS — K219 Gastro-esophageal reflux disease without esophagitis: Secondary | ICD-10-CM | POA: Diagnosis not present

## 2023-12-18 DIAGNOSIS — E871 Hypo-osmolality and hyponatremia: Secondary | ICD-10-CM | POA: Diagnosis not present

## 2023-12-18 DIAGNOSIS — D124 Benign neoplasm of descending colon: Secondary | ICD-10-CM | POA: Diagnosis not present

## 2023-12-18 DIAGNOSIS — D649 Anemia, unspecified: Secondary | ICD-10-CM | POA: Diagnosis not present

## 2023-12-18 DIAGNOSIS — E876 Hypokalemia: Secondary | ICD-10-CM | POA: Diagnosis not present

## 2023-12-18 DIAGNOSIS — K5790 Diverticulosis of intestine, part unspecified, without perforation or abscess without bleeding: Secondary | ICD-10-CM | POA: Diagnosis not present

## 2023-12-18 DIAGNOSIS — K5731 Diverticulosis of large intestine without perforation or abscess with bleeding: Secondary | ICD-10-CM | POA: Diagnosis not present

## 2023-12-18 DIAGNOSIS — R55 Syncope and collapse: Secondary | ICD-10-CM | POA: Diagnosis not present

## 2023-12-18 DIAGNOSIS — D126 Benign neoplasm of colon, unspecified: Secondary | ICD-10-CM | POA: Diagnosis not present

## 2023-12-18 DIAGNOSIS — D122 Benign neoplasm of ascending colon: Secondary | ICD-10-CM | POA: Diagnosis not present

## 2023-12-18 DIAGNOSIS — K921 Melena: Secondary | ICD-10-CM | POA: Diagnosis not present

## 2023-12-18 DIAGNOSIS — I10 Essential (primary) hypertension: Secondary | ICD-10-CM | POA: Diagnosis not present

## 2023-12-18 DIAGNOSIS — I959 Hypotension, unspecified: Secondary | ICD-10-CM | POA: Diagnosis not present

## 2023-12-18 DIAGNOSIS — D62 Acute posthemorrhagic anemia: Secondary | ICD-10-CM | POA: Diagnosis not present

## 2023-12-19 DIAGNOSIS — K5731 Diverticulosis of large intestine without perforation or abscess with bleeding: Secondary | ICD-10-CM | POA: Diagnosis not present

## 2023-12-19 DIAGNOSIS — D62 Acute posthemorrhagic anemia: Secondary | ICD-10-CM | POA: Diagnosis not present

## 2023-12-19 DIAGNOSIS — E871 Hypo-osmolality and hyponatremia: Secondary | ICD-10-CM | POA: Diagnosis not present

## 2023-12-20 DIAGNOSIS — D62 Acute posthemorrhagic anemia: Secondary | ICD-10-CM | POA: Diagnosis not present

## 2023-12-20 DIAGNOSIS — D122 Benign neoplasm of ascending colon: Secondary | ICD-10-CM | POA: Diagnosis not present

## 2023-12-20 DIAGNOSIS — K635 Polyp of colon: Secondary | ICD-10-CM | POA: Diagnosis not present

## 2023-12-20 DIAGNOSIS — D124 Benign neoplasm of descending colon: Secondary | ICD-10-CM | POA: Diagnosis not present

## 2023-12-20 DIAGNOSIS — I10 Essential (primary) hypertension: Secondary | ICD-10-CM | POA: Diagnosis not present

## 2023-12-20 DIAGNOSIS — K921 Melena: Secondary | ICD-10-CM | POA: Diagnosis not present

## 2023-12-20 DIAGNOSIS — R55 Syncope and collapse: Secondary | ICD-10-CM | POA: Diagnosis not present

## 2023-12-20 DIAGNOSIS — K5750 Diverticulosis of both small and large intestine without perforation or abscess without bleeding: Secondary | ICD-10-CM | POA: Diagnosis not present

## 2023-12-20 DIAGNOSIS — D649 Anemia, unspecified: Secondary | ICD-10-CM | POA: Diagnosis not present

## 2023-12-20 DIAGNOSIS — K5790 Diverticulosis of intestine, part unspecified, without perforation or abscess without bleeding: Secondary | ICD-10-CM | POA: Diagnosis not present

## 2023-12-20 DIAGNOSIS — K5731 Diverticulosis of large intestine without perforation or abscess with bleeding: Secondary | ICD-10-CM | POA: Diagnosis not present

## 2023-12-20 DIAGNOSIS — E871 Hypo-osmolality and hyponatremia: Secondary | ICD-10-CM | POA: Diagnosis not present

## 2023-12-20 DIAGNOSIS — D126 Benign neoplasm of colon, unspecified: Secondary | ICD-10-CM | POA: Diagnosis not present

## 2023-12-20 DIAGNOSIS — K219 Gastro-esophageal reflux disease without esophagitis: Secondary | ICD-10-CM | POA: Diagnosis not present

## 2023-12-21 DIAGNOSIS — K5731 Diverticulosis of large intestine without perforation or abscess with bleeding: Secondary | ICD-10-CM | POA: Diagnosis not present

## 2023-12-21 DIAGNOSIS — E871 Hypo-osmolality and hyponatremia: Secondary | ICD-10-CM | POA: Diagnosis not present

## 2023-12-21 DIAGNOSIS — D62 Acute posthemorrhagic anemia: Secondary | ICD-10-CM | POA: Diagnosis not present

## 2023-12-22 ENCOUNTER — Ambulatory Visit: Admitting: Physician Assistant

## 2023-12-22 DIAGNOSIS — K5731 Diverticulosis of large intestine without perforation or abscess with bleeding: Secondary | ICD-10-CM | POA: Diagnosis not present

## 2023-12-22 DIAGNOSIS — D62 Acute posthemorrhagic anemia: Secondary | ICD-10-CM | POA: Diagnosis not present

## 2023-12-22 DIAGNOSIS — E871 Hypo-osmolality and hyponatremia: Secondary | ICD-10-CM | POA: Diagnosis not present

## 2023-12-28 DIAGNOSIS — E871 Hypo-osmolality and hyponatremia: Secondary | ICD-10-CM | POA: Diagnosis not present

## 2023-12-28 DIAGNOSIS — Z6826 Body mass index (BMI) 26.0-26.9, adult: Secondary | ICD-10-CM | POA: Diagnosis not present

## 2023-12-28 DIAGNOSIS — D62 Acute posthemorrhagic anemia: Secondary | ICD-10-CM | POA: Diagnosis not present

## 2024-01-16 NOTE — Progress Notes (Signed)
 Brigitte Canard, PA-C [address] [place], [place]  [address] Phone: [phone]   Primary Care Physician: Beecher Bower, MD  Primary Gastroenterologist:  Brigitte Canard, PA-C / Legrand Puma, MD   Chief Complaint: Hospital follow-up acute gastroenteritis and hematemesis       HPI:   Donald Ball is a 88 y.o. male, established patient of Dr. Elvin Hammer, presents for hospital follow-up.  Hospitalized  12/07/23 until 12/10/2023 for enteritis, hyponatremia, coffee-ground emesis, hiatal hernia, and gastric polyps.  Initially presented to the ED with nausea, vomiting, and loose stools.  Had a few episodes black vomitus.  Denied melena or hematochezia.  CT abdomen pelvis showed nondistended loops of small bowel throughout the mid abdomen suggesting nonspecific enteritis.  Also cholelithiasis without evidence of cholecystitis.  Sodium 125, potassium 3.3, WBC 12.5 on admission. Hemoglobin dropped from 13g to 11.1 and remained stable. During hospitalization electrolytes were replaced.  Treated with IV antibiotics for possible bacterial gastroenteritis.  Antibiotics were discontinued.  12/09/23 EGD by Dr. Willy Harvest: showed no stigmata of bleeding.  5 cm hiatal hernia.  Multiple benign fundic gland gastric polyps.  Gastric biopsies negative for H. pylori, intestinal metaplasia, or malignancy.  He was hospitalized again 12/18/2023 until 12/20/2023 at [facility] for diverticular bleed.  He was passing multiple red bloody stools.  Underwent colonoscopy by Dr. Monico Anna at [facility] 12/20/2023.  He was found to have 17 diminutive adenomatous colon polyps with no dysplasia.  Pandiverticulosis.  No repeat colonoscopy was recommended.  Current symptoms: Since his last hospitalization 12/20/23, he has not had any more episodes of melena, hematochezia, hematemesis, nausea, or vomiting.  He denies any abdominal pain, heartburn, dysphagia or current GI symptoms.  He is currently taking MiraLAX  and fiber  daily.  Having a soft bowel movement every day.  He is on oral iron tablet once daily.  His labs have been monitored through his PCP Dr. Phyllis Breeze in [place].  He had labs 3 weeks ago and is scheduled for more lab work next week through his PCP.  He would like to switch from omeprazole  40mg  twice daily to 20mg  twice daily dose.  GERD is under control on this treatment.  He denies aspirin, NSAID use, or blood thinner use.  Currently feeling a lot better with no GI concerns today.  Current Outpatient Medications  Medication Sig Dispense Refill   BENICAR  HCT 40-12.5 MG tablet Take 1 tablet by mouth daily.     carvedilol  (COREG ) 25 MG tablet Take 25 mg by mouth 2 (two) times daily with a meal.     doxazosin  (CARDURA ) 8 MG tablet Take 8 mg by mouth daily.     ferrous sulfate 324 MG TBEC Take 324 mg by mouth.     finasteride  (PROSCAR ) 5 MG tablet Take 5 mg by mouth daily.     latanoprost  (XALATAN ) 0.005 % ophthalmic solution Place 1 drop into both eyes at bedtime.     Multiple Vitamin (MULTIVITAMIN) tablet Take 1 tablet by mouth daily.     olmesartan  (BENICAR ) 40 MG tablet Take 1 tablet (40 mg total) by mouth daily. 30 tablet 11   omeprazole  (PRILOSEC) 20 MG capsule Take 2 capsules (40 mg total) by mouth 2 (two) times daily before a meal. 120 capsule 0   vitamin C (ASCORBIC ACID) 500 MG tablet Take 500 mg by mouth daily.     No current facility-administered medications for this visit.    Allergies as of 01/17/2024 - Review  Complete 01/17/2024  Allergen Reaction Noted   Altace [ramipril]  10/27/2016   Hydralazine   10/27/2016   Nisoldipine  07/29/2009   Norvasc [amlodipine besylate]  10/27/2016   Sular [nisoldipine er]  10/27/2016   Penicillins  07/29/2009    Past Medical History:  Diagnosis Date   Anemia associated with acute blood loss    Benign essential hypertension    BPH (benign prostatic hyperplasia)    Diverticulosis    GERD (gastroesophageal reflux disease)    GI bleed     Hyperlipidemia    Melanoma in situ of left ear [facility])     Past Surgical History:  Procedure Laterality Date   CATARACT EXTRACTION     ESOPHAGOGASTRODUODENOSCOPY N/A 12/09/2023   Procedure: EGD (ESOPHAGOGASTRODUODENOSCOPY);  Surgeon: Kenney Peacemaker, MD;  Location: [facility];  Service: Gastroenterology;  Laterality: N/A;    Review of Systems:    All systems reviewed and negative except where noted in HPI.    Physical Exam:  BP 130/80   Pulse 64   Ht 6' (1.829 m)   Wt 223 lb (101.2 kg)   BMI 30.24 kg/m  No LMP for male patient.  General: Well-nourished, well-developed in no acute distress.  Lungs: Clear to auscultation bilaterally. Non-labored. Heart: Regular rate and rhythm, no murmurs rubs or gallops.  Abdomen: Bowel sounds are normal; Abdomen is Soft; No hepatosplenomegaly, masses; moderate diastasis recti.  No Abdominal Tenderness; No guarding or rebound tenderness. Neuro: Alert and oriented x 3.  Grossly intact.  Psych: Alert and cooperative, normal mood and affect.   Imaging Studies: No results found.  Labs: CBC    Component Value Date/Time   WBC 9.3 12/10/2023 0403   RBC 3.77 (L) 12/10/2023 0403   HGB 11.1 (L) 12/10/2023 0403   HCT 33.9 (L) 12/10/2023 0403   PLT 195 12/10/2023 0403   MCV 89.9 12/10/2023 0403   MCH 29.4 12/10/2023 0403   MCHC 32.7 12/10/2023 0403   RDW 14.9 12/10/2023 0403   LYMPHSABS 1.9 12/07/2023 0848   MONOABS 1.0 12/07/2023 0848   EOSABS 0.0 12/07/2023 0848   BASOSABS 0.0 12/07/2023 0848    CMP     Component Value Date/Time   NA 130 (L) 12/10/2023 0403   K 3.7 12/10/2023 0403   CL 98 12/10/2023 0403   CO2 25 12/10/2023 0403   GLUCOSE 111 (H) 12/10/2023 0403   BUN 19 12/10/2023 0403   CREATININE 1.02 12/10/2023 0403   CALCIUM 7.9 (L) 12/10/2023 0403   PROT 6.7 12/07/2023 0848   ALBUMIN 3.7 12/07/2023 0848   AST 36 12/07/2023 0848   ALT 11 12/07/2023 0848   ALKPHOS 36 (L) 12/07/2023 0848   BILITOT 0.7 12/07/2023 0848    GFRNONAA >60 12/10/2023 0403   GFRAA >60 02/12/2017 0354       Assessment and Plan:   Donald Ball is a 88 y.o. y/o male returns for hospital follow-up of:  1.  Acute viral gastroenteritis -currently resolved 2.  Acute blood loss anemia -improving 3.  Hematemesis -currently resolved 4.  Medium hiatal hernia 5.  Benign fundic gland gastric polyps 6.  Diverticular bleed -inactive at present 7.  Constipation -controlled on MiraLAX  and fiber 8.  GERD -controlled on PPI 9.  Adenomatous colon polyps  12/09/23 EGD by Dr. Willy Harvest: showed no stigmata of bleeding.  5 cm hiatal hernia.  Multiple benign fundic gland gastric polyps.  Gastric biopsies negative for H. pylori, intestinal metaplasia, or malignancy.  12/20/2023 Colonoscopy by Dr. Monico Anna: 17  diminutive adenomatous polyps removed with no dysplasia.  No repeat colonoscopy was recommended.  Patient is currently feeling a lot better.  No current GI symptoms.  Plan: - I offered to draw lab work for patient today, however he declined.  He states he is having his labs monitored through his PCP Dr. Phyllis Breeze in [place].  Labs 3 weeks ago showed stable hemoglobin.  He is scheduled for lab work next week again.  Currently on oral iron once daily.  Advised patient to continue follow-up with PCP to monitor labs and consider IV iron if needed.  Patient and his wife expressed  understanding. - Okay to decrease omeprazole  to 20 mg twice daily dose. - Avoid NSAIDs. - Continue MiraLAX  and fiber supplement to treat constipation. - I stressed the importance of preventing hard stools and straining in order to prevent diverticular bleeding in the future. - Return if he develops any recurrent or new GI symptoms.  Brigitte Canard, PA-C  Follow up as needed if recurrent GI symptoms

## 2024-01-17 ENCOUNTER — Encounter: Payer: Self-pay | Admitting: Physician Assistant

## 2024-01-17 ENCOUNTER — Ambulatory Visit: Admitting: Physician Assistant

## 2024-01-17 VITALS — BP 130/80 | HR 64 | Ht 72.0 in | Wt 223.0 lb

## 2024-01-17 DIAGNOSIS — Z8719 Personal history of other diseases of the digestive system: Secondary | ICD-10-CM | POA: Diagnosis not present

## 2024-01-17 DIAGNOSIS — D62 Acute posthemorrhagic anemia: Secondary | ICD-10-CM

## 2024-01-17 DIAGNOSIS — K529 Noninfective gastroenteritis and colitis, unspecified: Secondary | ICD-10-CM

## 2024-01-17 DIAGNOSIS — K449 Diaphragmatic hernia without obstruction or gangrene: Secondary | ICD-10-CM

## 2024-01-17 DIAGNOSIS — K317 Polyp of stomach and duodenum: Secondary | ICD-10-CM | POA: Diagnosis not present

## 2024-01-17 DIAGNOSIS — K219 Gastro-esophageal reflux disease without esophagitis: Secondary | ICD-10-CM

## 2024-01-17 DIAGNOSIS — K5900 Constipation, unspecified: Secondary | ICD-10-CM

## 2024-01-17 DIAGNOSIS — Z860101 Personal history of adenomatous and serrated colon polyps: Secondary | ICD-10-CM | POA: Diagnosis not present

## 2024-01-17 NOTE — Patient Instructions (Signed)
 Please follow up sooner if symptoms increase or worsen  Due to recent changes in healthcare laws, you may see the results of your imaging and laboratory studies on MyChart before your provider has had a chance to review them.  We understand that in some cases there may be results that are confusing or concerning to you. Not all laboratory results come back in the same time frame and the provider may be waiting for multiple results in order to interpret others.  Please give us  48 hours in order for your provider to thoroughly review all the results before contacting the office for clarification of your results.   _______________________________________________________  If your blood pressure at your visit was 140/90 or greater, please contact your primary care physician to follow up on this.  _______________________________________________________  If you are age 86 or older, your body mass index should be between 23-30. Your Body mass index is 30.24 kg/m. If this is out of the aforementioned range listed, please consider follow up with your Primary Care Provider.  If you are age 83 or younger, your body mass index should be between 19-25. Your Body mass index is 30.24 kg/m. If this is out of the aformentioned range listed, please consider follow up with your Primary Care Provider.   ________________________________________________________  The [facility] providers would like to encourage you to use MYCHART to communicate with providers for non-urgent requests or questions.  Due to long hold times on the telephone, sending your provider a message by [facility] may be a faster and more efficient way to get a response.  Please allow 48 business hours for a response.  Please remember that this is for non-urgent requests.  _______________________________________________________ Thank you for trusting me with your gastrointestinal care!   Brigitte Canard, PA

## 2024-01-17 NOTE — Progress Notes (Signed)
 Noted

## 2024-01-31 DIAGNOSIS — D509 Iron deficiency anemia, unspecified: Secondary | ICD-10-CM | POA: Diagnosis not present

## 2024-04-10 DIAGNOSIS — H43813 Vitreous degeneration, bilateral: Secondary | ICD-10-CM | POA: Diagnosis not present

## 2024-04-10 DIAGNOSIS — H26493 Other secondary cataract, bilateral: Secondary | ICD-10-CM | POA: Diagnosis not present

## 2024-04-10 DIAGNOSIS — D23112 Other benign neoplasm of skin of right lower eyelid, including canthus: Secondary | ICD-10-CM | POA: Diagnosis not present

## 2024-04-10 DIAGNOSIS — H1132 Conjunctival hemorrhage, left eye: Secondary | ICD-10-CM | POA: Diagnosis not present

## 2024-04-10 DIAGNOSIS — Z961 Presence of intraocular lens: Secondary | ICD-10-CM | POA: Diagnosis not present

## 2024-04-10 DIAGNOSIS — H43393 Other vitreous opacities, bilateral: Secondary | ICD-10-CM | POA: Diagnosis not present

## 2024-04-10 DIAGNOSIS — H3561 Retinal hemorrhage, right eye: Secondary | ICD-10-CM | POA: Diagnosis not present

## 2024-04-10 DIAGNOSIS — H47293 Other optic atrophy, bilateral: Secondary | ICD-10-CM | POA: Diagnosis not present

## 2024-04-10 DIAGNOSIS — H40053 Ocular hypertension, bilateral: Secondary | ICD-10-CM | POA: Diagnosis not present

## 2024-04-23 DIAGNOSIS — C44311 Basal cell carcinoma of skin of nose: Secondary | ICD-10-CM | POA: Diagnosis not present

## 2024-04-23 DIAGNOSIS — L821 Other seborrheic keratosis: Secondary | ICD-10-CM | POA: Diagnosis not present

## 2024-04-23 DIAGNOSIS — B078 Other viral warts: Secondary | ICD-10-CM | POA: Diagnosis not present

## 2024-04-23 DIAGNOSIS — L814 Other melanin hyperpigmentation: Secondary | ICD-10-CM | POA: Diagnosis not present

## 2024-04-23 DIAGNOSIS — D225 Melanocytic nevi of trunk: Secondary | ICD-10-CM | POA: Diagnosis not present

## 2024-04-23 DIAGNOSIS — Z85828 Personal history of other malignant neoplasm of skin: Secondary | ICD-10-CM | POA: Diagnosis not present

## 2024-04-23 DIAGNOSIS — Z85820 Personal history of malignant melanoma of skin: Secondary | ICD-10-CM | POA: Diagnosis not present

## 2024-04-23 DIAGNOSIS — C44319 Basal cell carcinoma of skin of other parts of face: Secondary | ICD-10-CM | POA: Diagnosis not present

## 2024-04-23 DIAGNOSIS — D0321 Melanoma in situ of right ear and external auricular canal: Secondary | ICD-10-CM | POA: Diagnosis not present

## 2024-05-16 DIAGNOSIS — Z85828 Personal history of other malignant neoplasm of skin: Secondary | ICD-10-CM | POA: Diagnosis not present

## 2024-05-16 DIAGNOSIS — L988 Other specified disorders of the skin and subcutaneous tissue: Secondary | ICD-10-CM | POA: Diagnosis not present

## 2024-05-16 DIAGNOSIS — D0321 Melanoma in situ of right ear and external auricular canal: Secondary | ICD-10-CM | POA: Diagnosis not present

## 2024-06-11 DIAGNOSIS — C44319 Basal cell carcinoma of skin of other parts of face: Secondary | ICD-10-CM | POA: Diagnosis not present

## 2024-06-11 DIAGNOSIS — Z85828 Personal history of other malignant neoplasm of skin: Secondary | ICD-10-CM | POA: Diagnosis not present

## 2024-06-11 DIAGNOSIS — C44311 Basal cell carcinoma of skin of nose: Secondary | ICD-10-CM | POA: Diagnosis not present
